# Patient Record
Sex: Female | Born: 1980
Health system: Southern US, Community
[De-identification: ages and names within clinical notes are randomized; demographics above are authoritative.]

## PROBLEM LIST (undated history)

## (undated) DIAGNOSIS — Z8489 Family history of other specified conditions: Secondary | ICD-10-CM

## (undated) DIAGNOSIS — N920 Excessive and frequent menstruation with regular cycle: Secondary | ICD-10-CM

## (undated) DIAGNOSIS — Z9889 Other specified postprocedural states: Secondary | ICD-10-CM

## (undated) DIAGNOSIS — E785 Hyperlipidemia, unspecified: Secondary | ICD-10-CM

## (undated) DIAGNOSIS — D649 Anemia, unspecified: Secondary | ICD-10-CM

## (undated) DIAGNOSIS — F419 Anxiety disorder, unspecified: Secondary | ICD-10-CM

## (undated) DIAGNOSIS — K219 Gastro-esophageal reflux disease without esophagitis: Secondary | ICD-10-CM

## (undated) DIAGNOSIS — N200 Calculus of kidney: Secondary | ICD-10-CM

## (undated) DIAGNOSIS — G43909 Migraine, unspecified, not intractable, without status migrainosus: Secondary | ICD-10-CM

## (undated) DIAGNOSIS — E0590 Thyrotoxicosis, unspecified without thyrotoxic crisis or storm: Secondary | ICD-10-CM

## (undated) HISTORY — PX: FINGER SURGERY: SHX640

## (undated) HISTORY — DX: Migraine, unspecified, not intractable, without status migrainosus: G43.909

## (undated) HISTORY — DX: Other specified postprocedural states: Z98.890

## (undated) HISTORY — PX: SMALL INTESTINE SURGERY: SHX150

## (undated) HISTORY — PX: FRACTURE SURGERY: SHX138

## (undated) HISTORY — DX: Anxiety disorder, unspecified: F41.9

## (undated) HISTORY — DX: Hyperlipidemia, unspecified: E78.5

## (undated) HISTORY — DX: Calculus of kidney: N20.0

## (undated) HISTORY — DX: Gastro-esophageal reflux disease without esophagitis: K21.9

## (undated) HISTORY — PX: APPENDECTOMY: SHX54

---
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
## 2000-02-11 HISTORY — PX: NISSEN FUNDOPLICATION: SHX2091

## 2006-02-10 HISTORY — PX: APPENDECTOMY: SHX54

## 2007-02-11 DIAGNOSIS — Z9889 Other specified postprocedural states: Secondary | ICD-10-CM

## 2007-02-11 HISTORY — DX: Other specified postprocedural states: Z98.890

## 2007-10-11 ENCOUNTER — Emergency Department: Payer: Self-pay | Admitting: Internal Medicine

## 2007-10-21 ENCOUNTER — Ambulatory Visit: Payer: Self-pay | Admitting: Family Medicine

## 2009-02-10 DIAGNOSIS — N200 Calculus of kidney: Secondary | ICD-10-CM

## 2009-02-10 HISTORY — DX: Calculus of kidney: N20.0

## 2009-06-14 ENCOUNTER — Ambulatory Visit: Payer: Self-pay | Admitting: Internal Medicine

## 2009-08-16 ENCOUNTER — Emergency Department: Payer: Self-pay | Admitting: Emergency Medicine

## 2011-01-16 ENCOUNTER — Other Ambulatory Visit: Payer: Self-pay | Admitting: Physician Assistant

## 2011-02-27 ENCOUNTER — Inpatient Hospital Stay: Payer: Self-pay | Admitting: Obstetrics and Gynecology

## 2011-02-28 LAB — CBC WITH DIFFERENTIAL/PLATELET
Basophil #: 0 10*3/uL (ref 0.0–0.1)
Basophil %: 0.4 %
Eosinophil #: 0.1 10*3/uL (ref 0.0–0.7)
HCT: 33.6 % — ABNORMAL LOW (ref 35.0–47.0)
HGB: 12 g/dL (ref 12.0–16.0)
Lymphocyte #: 1.6 10*3/uL (ref 1.0–3.6)
Lymphocyte %: 20.9 %
MCH: 34 pg (ref 26.0–34.0)
MCV: 95 fL (ref 80–100)
Monocyte #: 0.6 10*3/uL (ref 0.0–0.7)
Neutrophil #: 5.4 10*3/uL (ref 1.4–6.5)
RBC: 3.54 10*6/uL — ABNORMAL LOW (ref 3.80–5.20)
RDW: 12.5 % (ref 11.5–14.5)
WBC: 7.8 10*3/uL (ref 3.6–11.0)

## 2011-03-02 LAB — HEMATOCRIT: HCT: 28.1 % — ABNORMAL LOW (ref 35.0–47.0)

## 2011-03-04 LAB — CBC WITH DIFFERENTIAL/PLATELET
Basophil %: 0.3 %
Eosinophil %: 1 %
HGB: 9.5 g/dL — ABNORMAL LOW (ref 12.0–16.0)
Lymphocyte %: 15 %
MCH: 33.3 pg (ref 26.0–34.0)
MCV: 97 fL (ref 80–100)
Monocyte %: 5.1 %
Neutrophil %: 78.6 %
Platelet: 144 10*3/uL — ABNORMAL LOW (ref 150–440)
RBC: 2.84 10*6/uL — ABNORMAL LOW (ref 3.80–5.20)

## 2012-08-10 LAB — HM PAP SMEAR: HM Pap smear: NORMAL

## 2012-09-23 ENCOUNTER — Encounter: Payer: Self-pay | Admitting: Internal Medicine

## 2012-09-23 ENCOUNTER — Ambulatory Visit (INDEPENDENT_AMBULATORY_CARE_PROVIDER_SITE_OTHER): Payer: 59 | Admitting: Internal Medicine

## 2012-09-23 DIAGNOSIS — R1032 Left lower quadrant pain: Secondary | ICD-10-CM

## 2012-09-23 DIAGNOSIS — G43909 Migraine, unspecified, not intractable, without status migrainosus: Secondary | ICD-10-CM

## 2012-09-23 DIAGNOSIS — R1030 Lower abdominal pain, unspecified: Secondary | ICD-10-CM | POA: Insufficient documentation

## 2012-09-23 LAB — POCT URINALYSIS DIPSTICK
Blood, UA: NEGATIVE
Nitrite, UA: NEGATIVE
Urobilinogen, UA: 0.2
pH, UA: 6

## 2012-09-23 NOTE — Assessment & Plan Note (Signed)
Symptoms are concerning for recurrent ovarian cyst. Will get pelvic ultrasound for further evaluation. Urinalysis normal and urine pregnancy test negative today.

## 2012-09-23 NOTE — Progress Notes (Signed)
Subjective:    Patient ID: Kristie Woods, female    DOB: July 22, 1980, 32 y.o.   MRN: [id number]  HPI 32 year old female with history of migraine headaches, kidney stones presents to establish care. She reports she is generally feeling well. Her only concern today is several week history of left lower quadrant abdominal pain. The pain is described as intermittent and aching. It is mild in intensity. In the past, she was diagnosed with a left-sided ovarian cyst. She denies any nausea or vomiting. She denies change in bowel habits. She denies any dysuria, hematuria, fever, chills, flank pain. Appetite is normal.  Outpatient Encounter Prescriptions as of 09/23/2012  Medication Sig Dispense Refill  . Norgestimate-Ethinyl Estradiol Triphasic (TRI-SPRINTEC) 0.18/0.215/0.25 MG-35 MCG tablet Take 1 tablet by mouth daily.       No facility-administered encounter medications on file as of 09/23/2012.   BP 120/82  Pulse 78  Temp(Src) 99 F (37.2 C) (Oral)  Ht 5\' 4"  (1.626 m)  Wt 140 lb (63.504 kg)  BMI 24.02 kg/m2  SpO2 98%  LMP 09/08/2012  Review of Systems  Constitutional: Negative for fever, chills, appetite change, fatigue and unexpected weight change.  HENT: Negative for ear pain, congestion, sore throat, trouble swallowing, neck pain, voice change and sinus pressure.   Eyes: Negative for visual disturbance.  Respiratory: Negative for cough, shortness of breath, wheezing and stridor.   Cardiovascular: Negative for chest pain, palpitations and leg swelling.  Gastrointestinal: Positive for abdominal pain. Negative for nausea, vomiting, diarrhea, constipation, blood in stool, abdominal distention and anal bleeding.  Genitourinary: Negative for dysuria and flank pain.  Musculoskeletal: Negative for myalgias, arthralgias and gait problem.  Skin: Negative for color change and rash.  Neurological: Negative for dizziness and headaches.  Hematological: Negative for adenopathy. Does not bruise/bleed  easily.  Psychiatric/Behavioral: Negative for suicidal ideas, sleep disturbance and dysphoric mood. The patient is not nervous/anxious.        Objective:   Physical Exam  Constitutional: She is oriented to person, place, and time. She appears well-developed and well-nourished. No distress.  HENT:  Head: Normocephalic and atraumatic.  Right Ear: External ear normal.  Left Ear: External ear normal.  Nose: Nose normal.  Mouth/Throat: Oropharynx is clear and moist. No oropharyngeal exudate.  Eyes: Conjunctivae are normal. Pupils are equal, round, and reactive to light. Right eye exhibits no discharge. Left eye exhibits no discharge. No scleral icterus.  Neck: Normal range of motion. Neck supple. No tracheal deviation present. No thyromegaly present.  Cardiovascular: Normal rate, regular rhythm, normal heart sounds and intact distal pulses.  Exam reveals no gallop and no friction rub.   No murmur heard. Pulmonary/Chest: Effort normal and breath sounds normal. No accessory muscle usage. Not tachypneic. No respiratory distress. She has no decreased breath sounds. She has no wheezes. She has no rhonchi. She has no rales. She exhibits no tenderness.  Abdominal: Soft. Bowel sounds are normal. She exhibits no distension and no mass. There is tenderness (very mild LLQ, no palpable mass or abnormality). There is no rebound and no guarding.  Musculoskeletal: Normal range of motion. She exhibits no edema and no tenderness.  Lymphadenopathy:    She has no cervical adenopathy.  Neurological: She is alert and oriented to person, place, and time. No cranial nerve deficit. She exhibits normal muscle tone. Coordination normal.  Skin: Skin is warm and dry. No rash noted. She is not diaphoretic. No erythema. No pallor.  Psychiatric: She has a normal mood and affect. Her behavior  is normal. Judgment and thought content normal.          Assessment & Plan:

## 2012-09-23 NOTE — Assessment & Plan Note (Signed)
Symptoms are relatively infrequent and well controlled with use of Excedrin Migraine. Will continue.

## 2012-09-24 ENCOUNTER — Ambulatory Visit: Payer: Self-pay | Admitting: Internal Medicine

## 2012-09-24 ENCOUNTER — Telehealth: Payer: Self-pay | Admitting: Internal Medicine

## 2012-09-24 NOTE — Telephone Encounter (Signed)
Patient informed and stated she would contact her own GYN for an appointment, which is Dr. Ty Hilts at [facility]

## 2012-09-24 NOTE — Telephone Encounter (Signed)
US pelvis showed left ovarian cyst measuring 2.9x2.4x3.2cm. Given intermittent pain on the left side, I would recommend evaluation with GYN. We can set up referral if pt would like.

## 2012-10-04 ENCOUNTER — Encounter: Payer: Self-pay | Admitting: Internal Medicine

## 2012-12-06 ENCOUNTER — Encounter (INDEPENDENT_AMBULATORY_CARE_PROVIDER_SITE_OTHER): Payer: Self-pay

## 2012-12-06 ENCOUNTER — Ambulatory Visit (INDEPENDENT_AMBULATORY_CARE_PROVIDER_SITE_OTHER): Payer: 59 | Admitting: Internal Medicine

## 2012-12-06 ENCOUNTER — Encounter: Payer: Self-pay | Admitting: Internal Medicine

## 2012-12-06 ENCOUNTER — Ambulatory Visit: Payer: Self-pay | Admitting: Internal Medicine

## 2012-12-06 ENCOUNTER — Other Ambulatory Visit: Payer: Self-pay | Admitting: *Deleted

## 2012-12-06 VITALS — BP 118/84 | HR 80 | Temp 98.4°F | Wt 143.0 lb

## 2012-12-06 DIAGNOSIS — R109 Unspecified abdominal pain: Secondary | ICD-10-CM

## 2012-12-06 DIAGNOSIS — R52 Pain, unspecified: Secondary | ICD-10-CM

## 2012-12-06 LAB — POCT URINALYSIS DIPSTICK
Glucose, UA: NEGATIVE
Ketones, UA: NEGATIVE
Leukocytes, UA: NEGATIVE
pH, UA: 6

## 2012-12-06 MED ORDER — PROMETHAZINE HCL 12.5 MG PO TABS: 12.5000 mg | ORAL_TABLET | Freq: Three times a day (TID) | ORAL | Status: DC | PRN

## 2012-12-06 MED ORDER — CIPROFLOXACIN HCL 500 MG PO TABS: 500.0000 mg | ORAL_TABLET | Freq: Two times a day (BID) | ORAL | Status: DC

## 2012-12-06 MED ORDER — TRAMADOL HCL 50 MG PO TABS: 50.0000 mg | ORAL_TABLET | Freq: Three times a day (TID) | ORAL | Status: DC | PRN

## 2012-12-06 NOTE — Assessment & Plan Note (Addendum)
24hr of severe epigastric and left sided flank pain consistent with previous h/o nephrolithiasis. Urinalysis pos for trace blood. Will get stat CT abd/pel without contrast. Encouraged increased fluid intake, Tramadol prn pain, promethazine for nausea.

## 2012-12-06 NOTE — Telephone Encounter (Signed)
Prescription sent to the pharmacy after receiving call report from [facility] at [facility] radiology dept.

## 2012-12-06 NOTE — Progress Notes (Signed)
  Subjective:    Patient ID: Kristie Woods, female    DOB: 10-20-1980, 32 y.o.   MRN: [id number]  HPI 32 year old female presents for acute visit complaining of 24 hours of epigastric and left flank pain. Pain is described as twisting sensation. Pain is improved with physical activity such as walking. Pain is associated with nausea but no vomiting. Patient denies dysuria, hematuria, fever, chills. She reports this pain is consistent with previous episodes of nephrolithiasis. No known sick contacts. No recent travel.  Outpatient Prescriptions Prior to Visit  Medication Sig Dispense Refill  . Norgestimate-Ethinyl Estradiol Triphasic (TRI-SPRINTEC) 0.18/0.215/0.25 MG-35 MCG tablet Take 1 tablet by mouth daily.       No facility-administered medications prior to visit.   BP 118/84  Pulse 80  Temp(Src) 98.4 F (36.9 C) (Oral)  Wt 143 lb (64.864 kg)  BMI 24.53 kg/m2  SpO2 97%  LMP 11/25/2012    Review of Systems  Constitutional: Positive for fatigue. Negative for fever, chills, appetite change and unexpected weight change.  HENT: Negative for trouble swallowing.   Eyes: Negative for visual disturbance.  Respiratory: Negative for cough and shortness of breath.   Cardiovascular: Negative for chest pain, palpitations and leg swelling.  Gastrointestinal: Positive for nausea and abdominal pain. Negative for vomiting, diarrhea, constipation, blood in stool, abdominal distention and anal bleeding.  Genitourinary: Positive for flank pain. Negative for dysuria, urgency, frequency, hematuria, genital sores, vaginal pain and pelvic pain.  Musculoskeletal: Negative for arthralgias, gait problem, myalgias and neck pain.  Skin: Negative for color change and rash.  Neurological: Negative for dizziness and headaches.  Hematological: Negative for adenopathy. Does not bruise/bleed easily.  Psychiatric/Behavioral: Negative for dysphoric mood. The patient is not nervous/anxious.        Objective:   Physical Exam  Constitutional: She is oriented to person, place, and time. She appears well-developed and well-nourished. No distress.  HENT:  Head: Normocephalic and atraumatic.  Right Ear: External ear normal.  Left Ear: External ear normal.  Nose: Nose normal.  Mouth/Throat: Oropharynx is clear and moist.  Eyes: Conjunctivae are normal. Pupils are equal, round, and reactive to light. Right eye exhibits no discharge. Left eye exhibits no discharge. No scleral icterus.  Neck: Normal range of motion. Neck supple. No tracheal deviation present. No thyromegaly present.  Cardiovascular: Normal rate, regular rhythm, normal heart sounds and intact distal pulses.  Exam reveals no gallop and no friction rub.   No murmur heard. Pulmonary/Chest: Effort normal and breath sounds normal. No accessory muscle usage. Not tachypneic. No respiratory distress. She has no decreased breath sounds. She has no wheezes. She has no rhonchi. She has no rales. She exhibits no tenderness.  Abdominal: Soft. Bowel sounds are normal. She exhibits no distension and no mass. There is tenderness (left lower abdomen and left flank). There is no rebound and no guarding.  Musculoskeletal: Normal range of motion. She exhibits no edema and no tenderness.  Lymphadenopathy:    She has no cervical adenopathy.  Neurological: She is alert and oriented to person, place, and time. No cranial nerve deficit. She exhibits normal muscle tone. Coordination normal.  Skin: Skin is warm and dry. No rash noted. She is not diaphoretic. No erythema. No pallor.  Psychiatric: She has a normal mood and affect. Her behavior is normal. Judgment and thought content normal.          Assessment & Plan:

## 2012-12-07 ENCOUNTER — Telehealth: Payer: Self-pay | Admitting: Internal Medicine

## 2012-12-07 LAB — URINE CULTURE: Organism ID, Bacteria: NO GROWTH

## 2012-12-07 NOTE — Telephone Encounter (Signed)
Final report on CT abdomen showed just the one very small 2.52mm kidney stone on the right. Can you ask pt if her symptoms have improved at all after starting Cipro last night?

## 2012-12-08 NOTE — Telephone Encounter (Signed)
Spoke with patient yesterday, she state she is doing fine. The radiologist showed her the film and told her she had some fluid down there as well where they think the stone had burst.

## 2012-12-09 ENCOUNTER — Ambulatory Visit (INDEPENDENT_AMBULATORY_CARE_PROVIDER_SITE_OTHER): Payer: 59 | Admitting: Internal Medicine

## 2012-12-09 ENCOUNTER — Encounter: Payer: Self-pay | Admitting: Internal Medicine

## 2012-12-09 VITALS — BP 102/78 | HR 74 | Temp 98.2°F | Wt 141.0 lb

## 2012-12-09 DIAGNOSIS — R109 Unspecified abdominal pain: Secondary | ICD-10-CM

## 2012-12-09 DIAGNOSIS — R52 Pain, unspecified: Secondary | ICD-10-CM

## 2012-12-09 LAB — POCT URINALYSIS DIPSTICK
Bilirubin, UA: NEGATIVE
Blood, UA: NEGATIVE
Ketones, UA: NEGATIVE
Leukocytes, UA: NEGATIVE
Nitrite, UA: NEGATIVE
Urobilinogen, UA: 0.2
pH, UA: 6.5

## 2012-12-09 NOTE — Progress Notes (Signed)
  Subjective:    Patient ID: Kristie Woods, female    DOB: 1980-11-08, 32 y.o.   MRN: [id number]  HPI 32YO female with recent episode of flank pain and abdominal pain presents for follow up. Symptoms have completely resolved. CT abd showed small stone right, non-obstructive. Pt never had to use Tramadol or Phenergan. She has continued Cipro. Urine culture negative, so we discussed stopping Cipro today. No new concerns.  Outpatient Encounter Prescriptions as of 12/09/2012  Medication Sig Dispense Refill  . Norgestimate-Ethinyl Estradiol Triphasic (TRI-SPRINTEC) 0.18/0.215/0.25 MG-35 MCG tablet Take 1 tablet by mouth daily.      . [DISCONTINUED] ciprofloxacin (CIPRO) 500 MG tablet Take 1 tablet (500 mg total) by mouth 2 (two) times daily.  14 tablet  0  . [DISCONTINUED] promethazine (PHENERGAN) 12.5 MG tablet Take 1 tablet (12.5 mg total) by mouth every 8 (eight) hours as needed for nausea.  20 tablet  0  . [DISCONTINUED] traMADol (ULTRAM) 50 MG tablet Take 1 tablet (50 mg total) by mouth every 8 (eight) hours as needed for pain.  60 tablet  0   No facility-administered encounter medications on file as of 12/09/2012.   BP 102/78  Pulse 74  Temp(Src) 98.2 F (36.8 C) (Oral)  Wt 141 lb (63.957 kg)  BMI 24.19 kg/m2  SpO2 99%  LMP 11/25/2012  Review of Systems  Constitutional: Negative for fever, chills and fatigue.  Respiratory: Negative for cough and shortness of breath.   Cardiovascular: Negative for chest pain and leg swelling.  Gastrointestinal: Negative for abdominal pain.       Objective:   Physical Exam  Constitutional: She is oriented to person, place, and time. She appears well-developed and well-nourished. No distress.  HENT:  Head: Normocephalic and atraumatic.  Right Ear: External ear normal.  Left Ear: External ear normal.  Nose: Nose normal.  Mouth/Throat: Oropharynx is clear and moist. No oropharyngeal exudate.  Eyes: Conjunctivae are normal. Pupils are equal,  round, and reactive to light. Right eye exhibits no discharge. Left eye exhibits no discharge. No scleral icterus.  Neck: Normal range of motion. Neck supple. No tracheal deviation present. No thyromegaly present.  Cardiovascular: Normal rate, regular rhythm, normal heart sounds and intact distal pulses.  Exam reveals no gallop and no friction rub.   No murmur heard. Pulmonary/Chest: Effort normal and breath sounds normal. No accessory muscle usage. Not tachypneic. No respiratory distress. She has no decreased breath sounds. She has no wheezes. She has no rhonchi. She has no rales. She exhibits no tenderness.  Abdominal: There is no tenderness (no CVA tenderness).  Musculoskeletal: Normal range of motion. She exhibits no edema and no tenderness.  Lymphadenopathy:    She has no cervical adenopathy.  Neurological: She is alert and oriented to person, place, and time. No cranial nerve deficit. She exhibits normal muscle tone. Coordination normal.  Skin: Skin is warm and dry. No rash noted. She is not diaphoretic. No erythema. No pallor.  Psychiatric: She has a normal mood and affect. Her behavior is normal. Judgment and thought content normal.          Assessment & Plan:

## 2012-12-09 NOTE — Assessment & Plan Note (Addendum)
Symptoms have completely resolved. Urine culture was negative. Will stop cipro. CT showed small renal stone on the right side which was non-obstructive. Small amount of fluid in pelvis suggestive of possible ruptured ovarian cyst, however no current cyst present. Will continue increased fluid intake. Follow up prn if symptoms recur.

## 2012-12-21 ENCOUNTER — Encounter: Payer: Self-pay | Admitting: Internal Medicine

## 2014-01-10 LAB — HM PAP SMEAR

## 2014-01-17 ENCOUNTER — Encounter: Payer: Self-pay | Admitting: Internal Medicine

## 2014-01-17 ENCOUNTER — Encounter (INDEPENDENT_AMBULATORY_CARE_PROVIDER_SITE_OTHER): Payer: Self-pay

## 2014-01-17 ENCOUNTER — Ambulatory Visit (INDEPENDENT_AMBULATORY_CARE_PROVIDER_SITE_OTHER): Payer: 59 | Admitting: Internal Medicine

## 2014-01-17 VITALS — BP 99/68 | HR 78 | Temp 98.2°F | Ht 64.0 in | Wt 137.2 lb

## 2014-01-17 DIAGNOSIS — Z0000 Encounter for general adult medical examination without abnormal findings: Secondary | ICD-10-CM

## 2014-01-17 DIAGNOSIS — Z131 Encounter for screening for diabetes mellitus: Secondary | ICD-10-CM | POA: Insufficient documentation

## 2014-01-17 DIAGNOSIS — Z0001 Encounter for general adult medical examination with abnormal findings: Secondary | ICD-10-CM | POA: Insufficient documentation

## 2014-01-17 MED ORDER — ALPRAZOLAM 0.25 MG PO TABS: 0.2500 mg | ORAL_TABLET | Freq: Two times a day (BID) | ORAL | Status: DC | PRN

## 2014-01-17 NOTE — Assessment & Plan Note (Signed)
General medical exam normal today. Pelvic and breast exam deferred as completed by her OB last week. Immunizations UTD. Will check labs including CBC, CMP, lipids, TSH, Vit D. Encouraged healthy diet and exercise. Encourage smoking cessation.

## 2014-01-17 NOTE — Patient Instructions (Signed)

## 2014-01-17 NOTE — Progress Notes (Signed)
   Subjective:    Patient ID: Kristie Woods, female    DOB: Feb 22, 1980, 33 y.o.   MRN: [id number]  HPI 33YO female presents for annual exam.  Feeling well. No concerns today. Planning to travel to [place]. Has some anxiety in crowds and used Alprazolam in the past with improvement. Would like to use this again.  Had pelvic exam with OB last week. Everything normal.   Past medical, surgical, family and social history per today's encounter.  Review of Systems  Constitutional: Negative for fever, chills, appetite change, fatigue and unexpected weight change.  Eyes: Negative for visual disturbance.  Respiratory: Negative for shortness of breath.   Cardiovascular: Negative for chest pain and leg swelling.  Gastrointestinal: Negative for nausea, vomiting, abdominal pain, diarrhea and constipation.  Skin: Negative for color change and rash.  Hematological: Negative for adenopathy. Does not bruise/bleed easily.  Psychiatric/Behavioral: Negative for dysphoric mood. The patient is nervous/anxious.        Objective:    BP 99/68 mmHg  Pulse 78  Temp(Src) 98.2 F (36.8 C) (Oral)  Ht 5\' 4"  (1.626 m)  Wt 137 lb 4 oz (62.256 kg)  BMI 23.55 kg/m2  SpO2 100%  LMP 01/08/2014 Physical Exam  Constitutional: She is oriented to person, place, and time. She appears well-developed and well-nourished. No distress.  HENT:  Head: Normocephalic and atraumatic.  Right Ear: External ear normal.  Left Ear: External ear normal.  Nose: Nose normal.  Mouth/Throat: Oropharynx is clear and moist. No oropharyngeal exudate.  Eyes: Conjunctivae and EOM are normal. Pupils are equal, round, and reactive to light. Right eye exhibits no discharge.  Neck: Normal range of motion. Neck supple. No thyromegaly present.  Cardiovascular: Normal rate, regular rhythm, normal heart sounds and intact distal pulses.  Exam reveals no gallop and no friction rub.   No murmur heard. Pulmonary/Chest: Effort normal. No  respiratory distress. She has no wheezes. She has no rales.  Abdominal: Soft. Bowel sounds are normal. She exhibits no distension and no mass. There is no tenderness. There is no rebound and no guarding.  Musculoskeletal: Normal range of motion. She exhibits no edema or tenderness.  Lymphadenopathy:    She has no cervical adenopathy.  Neurological: She is alert and oriented to person, place, and time. No cranial nerve deficit. Coordination normal.  Skin: Skin is warm and dry. No rash noted. She is not diaphoretic. No erythema. No pallor.  Psychiatric: She has a normal mood and affect. Her behavior is normal. Judgment and thought content normal.          Assessment & Plan:   Problem List Items Addressed This Visit      Unprioritized   Routine general medical examination at a health care facility - Primary    General medical exam normal today. Pelvic and breast exam deferred as completed by her OB last week. Immunizations UTD. Will check labs including CBC, CMP, lipids, TSH, Vit D. Encouraged healthy diet and exercise. Encourage smoking cessation.    Relevant Orders      CBC with Differential      Comprehensive metabolic panel      Lipid panel      Microalbumin / creatinine urine ratio      Vit D  25 hydroxy (rtn osteoporosis monitoring)      TSH       Return in about 1 year (around 01/18/2015) for Physical.

## 2014-01-17 NOTE — Progress Notes (Signed)
Pre visit review using our clinic review tool, if applicable. No additional management support is needed unless otherwise documented below in the visit note. 

## 2014-01-18 ENCOUNTER — Telehealth: Payer: Self-pay | Admitting: Internal Medicine

## 2014-01-18 NOTE — Telephone Encounter (Signed)
emmi emailed °

## 2014-01-24 ENCOUNTER — Other Ambulatory Visit: Payer: 59

## 2014-01-26 ENCOUNTER — Other Ambulatory Visit: Payer: 59

## 2014-01-30 ENCOUNTER — Other Ambulatory Visit (INDEPENDENT_AMBULATORY_CARE_PROVIDER_SITE_OTHER): Payer: 59

## 2014-01-30 DIAGNOSIS — Z0000 Encounter for general adult medical examination without abnormal findings: Secondary | ICD-10-CM

## 2014-01-30 LAB — LIPID PANEL
CHOLESTEROL: 192 mg/dL (ref 0–200)
HDL: 58.4 mg/dL (ref >39.00)
LDL Cholesterol: 114 mg/dL — ABNORMAL HIGH (ref 0–99)
NonHDL: 133.6
TRIGLYCERIDES: 99 mg/dL (ref 0.0–149.0)
Total CHOL/HDL Ratio: 3
VLDL: 19.8 mg/dL (ref 0.0–40.0)

## 2014-01-30 LAB — COMPREHENSIVE METABOLIC PANEL
ALBUMIN: 4 g/dL (ref 3.5–5.2)
ALK PHOS: 53 U/L (ref 39–117)
ALT: 12 U/L (ref 0–35)
AST: 15 U/L (ref 0–37)
BUN: 13 mg/dL (ref 6–23)
CO2: 26 mEq/L (ref 19–32)
Calcium: 8.8 mg/dL (ref 8.4–10.5)
Chloride: 104 mEq/L (ref 96–112)
Creatinine, Ser: 0.8 mg/dL (ref 0.4–1.2)
GFR: 85.85 mL/min (ref >60.00)
GLUCOSE: 90 mg/dL (ref 70–99)
Potassium: 4.2 mEq/L (ref 3.5–5.1)
Sodium: 136 mEq/L (ref 135–145)
Total Bilirubin: 1.4 mg/dL — ABNORMAL HIGH (ref 0.2–1.2)
Total Protein: 6.8 g/dL (ref 6.0–8.3)

## 2014-01-30 LAB — CBC WITH DIFFERENTIAL/PLATELET
BASOS PCT: 0.6 % (ref 0.0–3.0)
Basophils Absolute: 0 10*3/uL (ref 0.0–0.1)
EOS PCT: 2.5 % (ref 0.0–5.0)
Eosinophils Absolute: 0.2 10*3/uL (ref 0.0–0.7)
HEMATOCRIT: 37.2 % (ref 36.0–46.0)
HEMOGLOBIN: 12.2 g/dL (ref 12.0–15.0)
Lymphocytes Relative: 21.4 % (ref 12.0–46.0)
Lymphs Abs: 1.7 10*3/uL (ref 0.7–4.0)
MCHC: 32.9 g/dL (ref 30.0–36.0)
MCV: 85.8 fl (ref 78.0–100.0)
MONO ABS: 0.6 10*3/uL (ref 0.1–1.0)
Monocytes Relative: 7.2 % (ref 3.0–12.0)
NEUTROS ABS: 5.3 10*3/uL (ref 1.4–7.7)
Neutrophils Relative %: 68.3 % (ref 43.0–77.0)
Platelets: 258 10*3/uL (ref 150.0–400.0)
RBC: 4.34 Mil/uL (ref 3.87–5.11)
RDW: 12.8 % (ref 11.5–15.5)
WBC: 7.8 10*3/uL (ref 4.0–10.5)

## 2014-01-30 LAB — MICROALBUMIN / CREATININE URINE RATIO
CREATININE, U: 204 mg/dL
Microalb Creat Ratio: 0.2 mg/g (ref 0.0–30.0)
Microalb, Ur: 0.4 mg/dL (ref 0.0–1.9)

## 2014-01-31 ENCOUNTER — Encounter: Payer: Self-pay | Admitting: Internal Medicine

## 2014-01-31 LAB — TSH: TSH: 1.74 u[IU]/mL (ref 0.35–4.50)

## 2014-01-31 LAB — VITAMIN D 25 HYDROXY (VIT D DEFICIENCY, FRACTURES): VITD: 22.93 ng/mL — ABNORMAL LOW (ref 30.00–100.00)

## 2014-01-31 MED ORDER — PRENATAL PLUS IRON 29-1 MG PO TABS: ORAL_TABLET | ORAL | Status: DC

## 2014-06-04 NOTE — Discharge Summary (Signed)
PATIENT NAME:  Kristie Woods, Kristie Woods MR#:  [id number] DATE OF BIRTH:  September 07, 1980  DATE OF ADMISSION:  02/27/2011 DATE OF DISCHARGE:  03/04/2011  HOSPITAL COURSE: Brought in for induction. Patient was laboring, had developed severe variable decelerations, was taken back for emergent primary low transverse cesarean section. Postoperatively patient did well. Postoperative day #1 hematocrit 28.1%. Patient is discharged to home postoperative day #3 without problems.   DISCHARGE MEDICATIONS:  1. Norco. 2. Patient did receive the On-Q pump for postoperative pain relief.   DISCHARGE INSTRUCTIONS: Staples removed before discharge. She will follow up with Dr. Ouida Sills in two weeks or before if she has wound drainage, fever, nausea, vomiting, heavy vaginal bleeding.  ____________________________ Boykin Nearing, MD tjs:cms D: 03/06/2011 20:37:57 ET T: 03/07/2011 11:23:17 ET JOB#: [id number]  cc: Boykin Nearing, MD, <Dictator> Boykin Nearing MD ELECTRONICALLY SIGNED 03/11/2011 8:45

## 2014-06-04 NOTE — Op Note (Signed)
PATIENT NAME:  Kristie Woods, Kristie Woods MR#:  [id number] DATE OF BIRTH:  1980-06-30  DATE OF PROCEDURE:  03/01/2011  PREOPERATIVE DIAGNOSIS: Nonreassuring fetal monitoring, active labor.   POSTOPERATIVE DIAGNOSIS: Nonreassuring fetal monitoring, active labor.   PROCEDURE: Emergent primary low transverse cesarean section.   SURGEON: Boykin Nearing, MD  FIRST ASSISTANTRonnald Ramp.   ANESTHESIA: General endotracheal.  INDICATIONS: A 34 year old gravida 1, para 0 patient laboring when fetal monitoring demonstrated deep variable decelerations. Pitocin was stopped. Patient underwent in utero resuscitation and was given subcutaneous terbutaline.   DESCRIPTION OF PROCEDURE: After adequate prep and drape patient was administered general endotracheal anesthesia by Dr. Kayleen Memos. Pfannenstiel incision was made. Sharp dissection was used to identify the fascia. Fascia was opened in the midline and opened in transverse fashion. Peritoneum was entered bluntly in the midline and direct low uterine incision was made. Upon entry into the endometrial cavity, clear fluid resulted. Fetal head was brought to the incision. A tight nuchal cord around the neck was reduced. Fetal body was then delivered without difficulty. Infant girl was passed to Dr. Nelda Marseille assigned Apgar scores of 3 and 8. Cord pH 7.28, pCO2 48. Placenta was manually delivered. Uterus was exteriorized and wiped clean with laparotomy tape. Uterine incision was closed with 1 chromic suture in a running locking fashion, good approximation of edges, good hemostasis noted. Fallopian tubes and ovaries appeared normal. Patient had previously received 2 grams Ancef prior to commencement of the case. Intravenous Pitocin administered while performing the repair. Posterior cul-de-sac suctioned. Uterus placed back into the abdominal cavity. Paracolic gutters wiped clean with laparotomy tape. Uterine incision again appeared hemostatic. On-Q system brought up to the  operative field. Two catheters placed infraumbilically subfascially. Fascia was then closed over top of these catheters with 0 Vicryl suture. Of note, Interceed was placed over the uterine incision T-shaped fashion for adhesion prevention. Subcutaneous tissues were then irrigated and bovied for hemostasis and skin was reapproximated with staples. On-Q catheters were then secured with Dermabond and Steri-Strips and were loaded with 5 mL each catheter of 0.5% Marcaine. There were no complications. Estimated blood loss 600 mL. Intraoperative fluids 1000 mL. Patient tolerated procedure, was taken to recovery room in good condition.   ____________________________ Boykin Nearing, MD tjs:cms D: 03/01/2011 20:25:25 ET T: 03/02/2011 09:37:33 ET JOB#: [id number]  cc: Boykin Nearing, MD, <Dictator> Boykin Nearing MD ELECTRONICALLY SIGNED 03/03/2011 9:40

## 2014-06-20 NOTE — H&P (Signed)
L&D Evaluation:  History:   HPI 34 yo G1P0 with LMP of 05/27/10 of 03/03/11 with [place] at [facility] significant for anemia, GBS pos here for IOL scheduled for Rt hip sciatica unrelieved during this pregnancy. No lOF, VB, decreased FM, occas UC after Cervidil last pm. Pt tol Cervidil and husband is at bedside with pt.    Presents with contractions    Patient's Medical History Anemia, Rh Neg, GBS pos    Patient's Surgical History none    Medications Pre Natal Vitamins    Allergies NKDA    Social History EtOH  social ETOH    Family History Non-Contributory   ROS:   ROS All systems were reviewed.  HEENT, CNS, GI, GU, Respiratory, CV, Renal and Musculoskeletal systems were found to be normal.   Exam:   Vital Signs stable    General no apparent distress    Mental Status clear    Heart normal sinus rhythm    Abdomen gravid, non-tender    Estimated Fetal Weight Average for gestational age    Back no CVAT    Edema 1+    Reflexes 1+    Clonus negative    Pelvic 1/60/vtx-2    Mebranes Intact    FHT normal rate with no decels, reactive NST with 2 accels with 15x15 bpm    Ucx regular    Skin dry    Lymph no lymphadenopathy   Impression:   Impression early labor, IUP at 39 4/7 weeks   Plan:   Plan monitor contractions and for cervical change    Comments Risks, benefits, alternatives, disc with pt. Pitocin infusing per protocol and foley bulb placed with 30 cc's balloon. Taped to Rt leg   Electronic Signatures: Catheryn Bacon (CNM)  (Signed 18-Jan-13 09:35)  Authored: L&D Evaluation   Last Updated: 18-Jan-13 09:35 by Catheryn Bacon (CNM)

## 2014-07-19 DIAGNOSIS — Z3490 Encounter for supervision of normal pregnancy, unspecified, unspecified trimester: Secondary | ICD-10-CM | POA: Insufficient documentation

## 2015-01-16 ENCOUNTER — Other Ambulatory Visit: Payer: 59

## 2015-01-23 ENCOUNTER — Encounter: Payer: 59 | Admitting: Internal Medicine

## 2015-01-24 NOTE — H&P (Signed)
Kristie Woods is a 34 y.o. female G2P1 with edc 02/22/15 based on LMP of 05/18/2014 presenting for elective repeat c/s and on Q pump on 02/16/2015. History  POBX : prior c/s  OB History    No data available     Past Medical History  Diagnosis Date  . GERD (gastroesophageal reflux disease)   . Kidney stones 2011  . Hyperlipidemia   . Migraine     occasional, no regular meds, uses OTC Excedrine Migraine  . H/O colposcopy with cervical biopsy 2009    [facility]   Past Surgical History  Procedure Laterality Date  . Appendectomy    . Cesarean section  2013    NRFHT  . Nissen fundoplication  [id number]   Family History: family history includes Alcohol abuse in her maternal grandmother; Crohn's disease in her brother; Heart disease in her father; Hyperlipidemia in her father and mother; Stroke (age of onset: 60) in her father; Thyroid disease in her mother; Ulcerative colitis in her brother. Social History:  reports that she has been smoking.  She has never used smokeless tobacco. She reports that she drinks alcohol. She reports that she does not use illicit drugs.   Prenatal Transfer Tool  Maternal Diabetes: No Genetic Screening: Declined Maternal Ultrasounds/Referrals: Normal Fetal Ultrasounds or other Referrals:  None Maternal Substance Abuse:  No Significant Maternal Medications:  None Significant Maternal Lab Results:  None Other Comments:  None  ROSunremarkable     There were no vitals taken for this visit. Exam Physical Exam  Prenatal labs: ABO, Rh:  A- Antibody:    neg Rubella:  immune  RPR:   neg HBsAg:   neg HIV:   neg GBS:   unknown   Assessment/Plan: Elective repeat LTCS + on Q pump 39+[redacted] week EGA   Risks and benefits of the procedure have been discussed with pt  All questions answered  Walker Paddack 01/24/2015, 12:01 PM

## 2015-01-26 DIAGNOSIS — A491 Streptococcal infection, unspecified site: Secondary | ICD-10-CM

## 2015-01-26 HISTORY — DX: Streptococcal infection, unspecified site: A49.1

## 2015-02-11 HISTORY — DX: Maternal care for unspecified type scar from previous cesarean delivery: O34.219

## 2015-02-14 DIAGNOSIS — D62 Acute posthemorrhagic anemia: Secondary | ICD-10-CM | POA: Diagnosis not present

## 2015-02-14 DIAGNOSIS — O34211 Maternal care for low transverse scar from previous cesarean delivery: Secondary | ICD-10-CM | POA: Diagnosis not present

## 2015-02-14 DIAGNOSIS — O99284 Endocrine, nutritional and metabolic diseases complicating childbirth: Secondary | ICD-10-CM | POA: Diagnosis not present

## 2015-02-14 DIAGNOSIS — O9962 Diseases of the digestive system complicating childbirth: Secondary | ICD-10-CM | POA: Diagnosis not present

## 2015-02-14 DIAGNOSIS — O99334 Smoking (tobacco) complicating childbirth: Secondary | ICD-10-CM | POA: Diagnosis not present

## 2015-02-14 DIAGNOSIS — E785 Hyperlipidemia, unspecified: Secondary | ICD-10-CM | POA: Diagnosis not present

## 2015-02-14 DIAGNOSIS — K219 Gastro-esophageal reflux disease without esophagitis: Secondary | ICD-10-CM | POA: Diagnosis not present

## 2015-02-14 DIAGNOSIS — Z3A39 39 weeks gestation of pregnancy: Secondary | ICD-10-CM | POA: Diagnosis not present

## 2015-02-15 ENCOUNTER — Encounter
Admission: RE | Admit: 2015-02-15 | Discharge: 2015-02-15 | Disposition: A | Discharge status: Home / Self Care | Payer: 59 | Source: Ambulatory Visit | Attending: Obstetrics and Gynecology | Admitting: Obstetrics and Gynecology

## 2015-02-15 HISTORY — DX: Anemia, unspecified: D64.9

## 2015-02-15 LAB — CBC
HEMATOCRIT: 35.8 % (ref 35.0–47.0)
Hemoglobin: 12.1 g/dL (ref 12.0–16.0)
MCH: 29.6 pg (ref 26.0–34.0)
MCHC: 33.8 g/dL (ref 32.0–36.0)
MCV: 87.6 fL (ref 80.0–100.0)
Platelets: 160 10*3/uL (ref 150–440)
RBC: 4.08 MIL/uL (ref 3.80–5.20)
RDW: 13.3 % (ref 11.5–14.5)
WBC: 7.8 10*3/uL (ref 3.6–11.0)

## 2015-02-15 LAB — ABO/RH: ABO/RH(D): A NEG

## 2015-02-16 ENCOUNTER — Inpatient Hospital Stay
Admission: RE | Admit: 2015-02-16 | Discharge: 2015-02-19 | DRG: 765 | Disposition: A | Discharge status: Home / Self Care | Payer: 59 | Source: Ambulatory Visit | Attending: Obstetrics and Gynecology | Admitting: Obstetrics and Gynecology

## 2015-02-16 ENCOUNTER — Inpatient Hospital Stay: Payer: 59 | Admitting: Anesthesiology

## 2015-02-16 ENCOUNTER — Encounter
Admission: RE | Disposition: A | Discharge status: Home / Self Care | Payer: Self-pay | Source: Ambulatory Visit | Attending: Obstetrics and Gynecology

## 2015-02-16 DIAGNOSIS — O9962 Diseases of the digestive system complicating childbirth: Secondary | ICD-10-CM | POA: Diagnosis present

## 2015-02-16 DIAGNOSIS — Z811 Family history of alcohol abuse and dependence: Secondary | ICD-10-CM

## 2015-02-16 DIAGNOSIS — O9902 Anemia complicating childbirth: Secondary | ICD-10-CM | POA: Diagnosis present

## 2015-02-16 DIAGNOSIS — O34211 Maternal care for low transverse scar from previous cesarean delivery: Principal | ICD-10-CM | POA: Diagnosis present

## 2015-02-16 DIAGNOSIS — O99334 Smoking (tobacco) complicating childbirth: Secondary | ICD-10-CM | POA: Diagnosis present

## 2015-02-16 DIAGNOSIS — K219 Gastro-esophageal reflux disease without esophagitis: Secondary | ICD-10-CM | POA: Diagnosis present

## 2015-02-16 DIAGNOSIS — D62 Acute posthemorrhagic anemia: Secondary | ICD-10-CM | POA: Diagnosis present

## 2015-02-16 DIAGNOSIS — Z3A00 Weeks of gestation of pregnancy not specified: Secondary | ICD-10-CM | POA: Diagnosis not present

## 2015-02-16 DIAGNOSIS — Z8249 Family history of ischemic heart disease and other diseases of the circulatory system: Secondary | ICD-10-CM | POA: Diagnosis not present

## 2015-02-16 DIAGNOSIS — Z823 Family history of stroke: Secondary | ICD-10-CM | POA: Diagnosis not present

## 2015-02-16 DIAGNOSIS — Z3A39 39 weeks gestation of pregnancy: Secondary | ICD-10-CM

## 2015-02-16 DIAGNOSIS — O99284 Endocrine, nutritional and metabolic diseases complicating childbirth: Secondary | ICD-10-CM | POA: Diagnosis present

## 2015-02-16 DIAGNOSIS — Z9889 Other specified postprocedural states: Secondary | ICD-10-CM

## 2015-02-16 DIAGNOSIS — G8918 Other acute postprocedural pain: Secondary | ICD-10-CM | POA: Diagnosis not present

## 2015-02-16 DIAGNOSIS — E785 Hyperlipidemia, unspecified: Secondary | ICD-10-CM | POA: Diagnosis present

## 2015-02-16 DIAGNOSIS — R1084 Generalized abdominal pain: Secondary | ICD-10-CM | POA: Diagnosis not present

## 2015-02-16 DIAGNOSIS — O34219 Maternal care for unspecified type scar from previous cesarean delivery: Secondary | ICD-10-CM | POA: Diagnosis present

## 2015-02-16 DIAGNOSIS — F17210 Nicotine dependence, cigarettes, uncomplicated: Secondary | ICD-10-CM | POA: Diagnosis present

## 2015-02-16 DIAGNOSIS — O99824 Streptococcus B carrier state complicating childbirth: Secondary | ICD-10-CM | POA: Diagnosis not present

## 2015-02-16 LAB — TYPE AND SCREEN
ABO/RH(D): A NEG
ANTIBODY SCREEN: POSITIVE
Extend sample reason: UNDETERMINED

## 2015-02-16 LAB — RPR: RPR: NONREACTIVE

## 2015-02-16 SURGERY — Surgical Case
Anesthesia: Spinal

## 2015-02-16 MED ORDER — IBUPROFEN 600 MG PO TABS: 600.0000 mg | ORAL_TABLET | Freq: Four times a day (QID) | ORAL | Status: DC

## 2015-02-16 MED ORDER — LIDOCAINE HCL (PF) 1 % IJ SOLN
30.0000 mL | INTRAMUSCULAR | Status: DC | PRN
  Filled 2015-02-16: qty 30

## 2015-02-16 MED ORDER — DIPHENHYDRAMINE HCL 25 MG PO CAPS: 25.0000 mg | ORAL_CAPSULE | ORAL | Status: DC | PRN

## 2015-02-16 MED ORDER — NALBUPHINE HCL 10 MG/ML IJ SOLN
5.0000 mg | INTRAMUSCULAR | Status: DC | PRN
Start: 2015-02-16 — End: 2015-02-16

## 2015-02-16 MED ORDER — OXYCODONE-ACETAMINOPHEN 5-325 MG PO TABS: 2.0000 | ORAL_TABLET | ORAL | Status: DC | PRN

## 2015-02-16 MED ORDER — SODIUM CHLORIDE 0.9 % IJ SOLN: 3.0000 mL | INTRAMUSCULAR | Status: DC | PRN

## 2015-02-16 MED ORDER — LACTATED RINGERS IV SOLN: INTRAVENOUS | Status: DC

## 2015-02-16 MED ORDER — IBUPROFEN 600 MG PO TABS
600.0000 mg | ORAL_TABLET | Freq: Four times a day (QID) | ORAL | Status: DC | PRN
  Administered 2015-02-17 – 2015-02-18 (×2): 600 mg via ORAL
  Filled 2015-02-16 (×5): qty 1

## 2015-02-16 MED ORDER — PHENYLEPHRINE HCL 10 MG/ML IJ SOLN
INTRAMUSCULAR | Status: DC | PRN
  Administered 2015-02-16 (×3): 100 ug via INTRAVENOUS

## 2015-02-16 MED ORDER — BUPIVACAINE HCL (PF) 0.5 % IJ SOLN
INTRAMUSCULAR | Status: DC | PRN
  Administered 2015-02-16: 10 mL

## 2015-02-16 MED ORDER — CEFAZOLIN SODIUM-DEXTROSE 2-3 GM-% IV SOLR
2.0000 g | Freq: Once | INTRAVENOUS | Status: AC
  Administered 2015-02-16: 2 g via INTRAVENOUS
  Filled 2015-02-16: qty 50

## 2015-02-16 MED ORDER — WITCH HAZEL-GLYCERIN EX PADS: 1.0000 "application " | MEDICATED_PAD | CUTANEOUS | Status: DC | PRN

## 2015-02-16 MED ORDER — CITRIC ACID-SODIUM CITRATE 334-500 MG/5ML PO SOLN
ORAL | Status: AC
  Administered 2015-02-16: 30 mL via ORAL
  Filled 2015-02-16: qty 15

## 2015-02-16 MED ORDER — ACETAMINOPHEN 325 MG PO TABS: 650.0000 mg | ORAL_TABLET | ORAL | Status: DC | PRN

## 2015-02-16 MED ORDER — LANOLIN HYDROUS EX OINT
1.0000 | TOPICAL_OINTMENT | CUTANEOUS | Status: DC | PRN
Start: 2015-02-16 — End: 2015-02-19

## 2015-02-16 MED ORDER — MENTHOL 3 MG MT LOZG: 1.0000 | LOZENGE | OROMUCOSAL | Status: DC | PRN

## 2015-02-16 MED ORDER — BUPIVACAINE HCL (PF) 0.75 % IJ SOLN
INTRAMUSCULAR | Status: DC | PRN
  Administered 2015-02-16: 1.8 mL via INTRATHECAL

## 2015-02-16 MED ORDER — SIMETHICONE 80 MG PO CHEW: 80.0000 mg | CHEWABLE_TABLET | ORAL | Status: DC

## 2015-02-16 MED ORDER — SCOPOLAMINE 1 MG/3DAYS TD PT72: 1.0000 | MEDICATED_PATCH | Freq: Once | TRANSDERMAL | Status: DC

## 2015-02-16 MED ORDER — ZOLPIDEM TARTRATE 5 MG PO TABS: 5.0000 mg | ORAL_TABLET | Freq: Every evening | ORAL | Status: DC | PRN

## 2015-02-16 MED ORDER — NALBUPHINE HCL 10 MG/ML IJ SOLN: 5.0000 mg | Freq: Once | INTRAMUSCULAR | Status: DC | PRN

## 2015-02-16 MED ORDER — CITRIC ACID-SODIUM CITRATE 334-500 MG/5ML PO SOLN
30.0000 mL | ORAL | Status: DC | PRN
  Administered 2015-02-16: 30 mL via ORAL

## 2015-02-16 MED ORDER — ONDANSETRON HCL 4 MG/2ML IJ SOLN: 4.0000 mg | Freq: Four times a day (QID) | INTRAMUSCULAR | Status: DC | PRN

## 2015-02-16 MED ORDER — FENTANYL CITRATE (PF) 100 MCG/2ML IJ SOLN: 25.0000 ug | INTRAMUSCULAR | Status: DC | PRN

## 2015-02-16 MED ORDER — SIMETHICONE 80 MG PO CHEW
80.0000 mg | CHEWABLE_TABLET | ORAL | Status: DC | PRN
  Filled 2015-02-16: qty 1

## 2015-02-16 MED ORDER — OXYTOCIN 40 UNITS IN LACTATED RINGERS INFUSION - SIMPLE MED: 2.5000 [IU]/h | INTRAVENOUS | Status: DC

## 2015-02-16 MED ORDER — OXYTOCIN BOLUS FROM INFUSION: 500.0000 mL | INTRAVENOUS | Status: DC

## 2015-02-16 MED ORDER — OXYCODONE-ACETAMINOPHEN 5-325 MG PO TABS: 1.0000 | ORAL_TABLET | ORAL | Status: DC | PRN

## 2015-02-16 MED ORDER — LACTATED RINGERS IV SOLN
500.0000 mL | INTRAVENOUS | Status: DC | PRN
  Administered 2015-02-16: 1000 mL via INTRAVENOUS

## 2015-02-16 MED ORDER — BUPIVACAINE 0.25 % ON-Q PUMP DUAL CATH 400 ML: 400.0000 mL | INJECTION | Status: DC

## 2015-02-16 MED ORDER — SIMETHICONE 80 MG PO CHEW
80.0000 mg | CHEWABLE_TABLET | Freq: Three times a day (TID) | ORAL | Status: DC
  Administered 2015-02-17 – 2015-02-18 (×4): 80 mg via ORAL
  Filled 2015-02-16 (×7): qty 1

## 2015-02-16 MED ORDER — ONDANSETRON HCL 4 MG/2ML IJ SOLN
4.0000 mg | Freq: Three times a day (TID) | INTRAMUSCULAR | Status: DC | PRN
  Filled 2015-02-16: qty 2

## 2015-02-16 MED ORDER — NALOXONE HCL 2 MG/2ML IJ SOSY
1.0000 ug/kg/h | PREFILLED_SYRINGE | INTRAVENOUS | Status: DC | PRN
  Filled 2015-02-16: qty 2

## 2015-02-16 MED ORDER — MEPERIDINE HCL 25 MG/ML IJ SOLN: 6.2500 mg | INTRAMUSCULAR | Status: DC | PRN

## 2015-02-16 MED ORDER — NALOXONE HCL 0.4 MG/ML IJ SOLN: 0.4000 mg | INTRAMUSCULAR | Status: DC | PRN

## 2015-02-16 MED ORDER — NALBUPHINE HCL 10 MG/ML IJ SOLN: 5.0000 mg | INTRAMUSCULAR | Status: DC | PRN

## 2015-02-16 MED ORDER — DIPHENHYDRAMINE HCL 50 MG/ML IJ SOLN: 12.5000 mg | INTRAMUSCULAR | Status: DC | PRN

## 2015-02-16 MED ORDER — OXYTOCIN 40 UNITS IN LACTATED RINGERS INFUSION - SIMPLE MED
INTRAVENOUS | Status: AC
  Administered 2015-02-16: 62.5 mL/h via INTRAVENOUS
  Filled 2015-02-16: qty 1000

## 2015-02-16 MED ORDER — TERBUTALINE SULFATE 1 MG/ML IJ SOLN: 0.2500 mg | Freq: Once | INTRAMUSCULAR | Status: DC

## 2015-02-16 MED ORDER — PRENATAL MULTIVITAMIN CH
1.0000 | ORAL_TABLET | Freq: Every day | ORAL | Status: DC
  Administered 2015-02-17 – 2015-02-19 (×3): 1 via ORAL
  Filled 2015-02-16 (×3): qty 1

## 2015-02-16 MED ORDER — ONDANSETRON HCL 4 MG/2ML IJ SOLN
4.0000 mg | Freq: Once | INTRAMUSCULAR | Status: AC | PRN
  Administered 2015-02-16: 4 mg via INTRAVENOUS

## 2015-02-16 MED ORDER — OXYTOCIN 40 UNITS IN LACTATED RINGERS INFUSION - SIMPLE MED
62.5000 mL/h | INTRAVENOUS | Status: DC
  Administered 2015-02-16: 62.5 mL/h via INTRAVENOUS
  Filled 2015-02-16: qty 1000

## 2015-02-16 MED ORDER — BUPIVACAINE HCL (PF) 0.5 % IJ SOLN
INTRAMUSCULAR | Status: AC
  Filled 2015-02-16: qty 30

## 2015-02-16 MED ORDER — DIBUCAINE 1 % RE OINT: 1.0000 "application " | TOPICAL_OINTMENT | RECTAL | Status: DC | PRN

## 2015-02-16 MED ORDER — TETANUS-DIPHTH-ACELL PERTUSSIS 5-2.5-18.5 LF-MCG/0.5 IM SUSP: 0.5000 mL | Freq: Once | INTRAMUSCULAR | Status: DC

## 2015-02-16 MED ORDER — LACTATED RINGERS IV SOLN
INTRAVENOUS | Status: DC
  Administered 2015-02-16: 08:00:00 via INTRAVENOUS

## 2015-02-16 MED ORDER — OXYTOCIN 10 UNIT/ML IJ SOLN
40.0000 [IU] | INTRAVENOUS | Status: DC | PRN
  Administered 2015-02-16: 40 [IU] via INTRAVENOUS

## 2015-02-16 MED ORDER — BUPIVACAINE 0.25 % ON-Q PUMP DUAL CATH 400 ML
INJECTION | Status: AC
  Filled 2015-02-16: qty 400

## 2015-02-16 MED ORDER — SENNOSIDES-DOCUSATE SODIUM 8.6-50 MG PO TABS
2.0000 | ORAL_TABLET | ORAL | Status: DC
  Filled 2015-02-16 (×2): qty 2

## 2015-02-16 SURGICAL SUPPLY — 23 items
BARRIER ADHS 3X4 INTERCEED (GAUZE/BANDAGES/DRESSINGS) IMPLANT
CANISTER SUCT 3000ML (MISCELLANEOUS) ×2 IMPLANT
CATH KIT ON-Q SILVERSOAK 5IN (CATHETERS) ×4 IMPLANT
CHLORAPREP W/TINT 26ML (MISCELLANEOUS) ×2 IMPLANT
DRSG TEGADERM 8X12 (GAUZE/BANDAGES/DRESSINGS) ×2 IMPLANT
DRSG TELFA 3X8 NADH (GAUZE/BANDAGES/DRESSINGS) ×2 IMPLANT
ELECT CAUTERY BLADE 6.4 (BLADE) ×2 IMPLANT
GAUZE SPONGE 4X4 12PLY STRL (GAUZE/BANDAGES/DRESSINGS) ×2 IMPLANT
GLOVE BIO SURGEON STRL SZ8 (GLOVE) ×8 IMPLANT
GOWN STRL REUS W/ TWL LRG LVL3 (GOWN DISPOSABLE) ×2 IMPLANT
GOWN STRL REUS W/ TWL XL LVL3 (GOWN DISPOSABLE) ×1 IMPLANT
GOWN STRL REUS W/TWL LRG LVL3 (GOWN DISPOSABLE) ×2
GOWN STRL REUS W/TWL XL LVL3 (GOWN DISPOSABLE) ×1
NS IRRIG 1000ML POUR BTL (IV SOLUTION) ×2 IMPLANT
PACK C SECTION AR (MISCELLANEOUS) ×2 IMPLANT
PAD GROUND ADULT SPLIT (MISCELLANEOUS) ×2 IMPLANT
PAD OB MATERNITY 4.3X12.25 (PERSONAL CARE ITEMS) ×2 IMPLANT
PAD PREP 24X41 OB/GYN DISP (PERSONAL CARE ITEMS) ×2 IMPLANT
STAPLER INSORB 30 2030 C-SECTI (MISCELLANEOUS) ×2 IMPLANT
STRAP SAFETY BODY (MISCELLANEOUS) ×2 IMPLANT
SUT CHROMIC 1 CTX 36 (SUTURE) ×6 IMPLANT
SUT PLAIN GUT 0 (SUTURE) IMPLANT
SUT VIC AB 0 CT1 36 (SUTURE) ×6 IMPLANT

## 2015-02-16 NOTE — Plan of Care (Signed)
Pt moved to MBU 340 via bed in stable condition. Report to next shift Conservation officer, historic buildings. Lenore Cordia RNC

## 2015-02-16 NOTE — Transfer of Care (Signed)
Immediate Anesthesia Transfer of Care Note  Patient: Kristie Woods  Procedure(s) Performed: Procedure(s): CESAREAN SECTION (N/A)  Patient Location: PACU  Anesthesia Type:Spinal  Level of Consciousness: awake  Airway & Oxygen Therapy: Patient Spontanous Breathing and Patient connected to nasal cannula oxygen  Post-op Assessment: Report given to RN and Post -op Vital signs reviewed and stable  Post vital signs: Reviewed and stable  Last Vitals:  Filed Vitals:   02/16/15 0557  BP: 98/70  Pulse: 22  Temp: 36.9 C  Resp: 16    Complications: No apparent anesthesia complications

## 2015-02-16 NOTE — Anesthesia Procedure Notes (Addendum)
Spinal Patient location during procedure: OB Start time: 02/16/2015 7:40 AM End time: 02/16/2015 7:51 AM Reason for block: at surgeon's request Staffing Anesthesiologist: Marline Backbone F Performed by: anesthesiologist  Preanesthetic Checklist Completed: patient identified, site marked, surgical consent, pre-op evaluation, timeout performed, IV checked, risks and benefits discussed, monitors and equipment checked and at surgeon's request Spinal Block Patient position: sitting Prep: Betadine Patient monitoring: heart rate and blood pressure Approach: midline Location: L3-4 Injection technique: single-shot Needle Needle type: Quincke  Needle gauge: 25 G Needle length: 9 cm Needle insertion depth: 6 cm Assessment Sensory level: T6  Date/Time: 02/16/2015 7:40 AM Performed by: Allean Found Pre-anesthesia Checklist: Patient identified, Emergency Drugs available, Suction available, Patient being monitored and Timeout performed Patient Re-evaluated:Patient Re-evaluated prior to inductionOxygen Delivery Method: Nasal cannula

## 2015-02-16 NOTE — Progress Notes (Signed)
Pt interviewed . Labs nl . Ready to proceed with repeat LTCS + ON q pump. All questions answered

## 2015-02-16 NOTE — Brief Op Note (Signed)
02/16/2015  8:56 AM  PATIENT:  Kristie Woods  35 y.o. female  PRE-OPERATIVE DIAGNOSIS:  elective repeat  POST-OPERATIVE DIAGNOSIS:  prior c-section  PROCEDURE:  Procedure(s): CESAREAN SECTION (N/A)  SURGEON:  Surgeon(s) and Role:    Boykin Nearing, MD - Primary  PHYSICIAN ASSISTANT:   ASSISTANTS: sigmon   ANESTHESIA:   spinal  EBL:  Total I/O In: 1400 [I.V.:1400] Out: 550 [Urine:150; Blood:400]  BLOOD ADMINISTERED:none  DRAINS: Urinary Catheter (Foley)   LOCAL MEDICATIONS USED:  MARCAINE     SPECIMEN:  No Specimen  DISPOSITION OF SPECIMEN:  N/A  COUNTS:  YES  TOURNIQUET:  * No tourniquets in log *  DICTATION: .Other Dictation: Dictation Number verbal  PLAN OF CARE: Admit to inpatient   PATIENT DISPOSITION:  PACU - hemodynamically stable.   Delay start of Pharmacological VTE agent (>24hrs) due to surgical blood loss or risk of bleeding: not applicable

## 2015-02-16 NOTE — Anesthesia Preprocedure Evaluation (Addendum)
Anesthesia Evaluation  Patient identified by MRN, date of birth, ID band Patient awake    Reviewed: Allergy & Precautions, NPO status   Airway Mallampati: II       Dental no notable dental hx.    Pulmonary neg pulmonary ROS, former smoker,    breath sounds clear to auscultation       Cardiovascular negative cardio ROS   Rhythm:Regular     Neuro/Psych    GI/Hepatic negative GI ROS, Neg liver ROS, GERD  Medicated,  Endo/Other    Renal/GU      Musculoskeletal   Abdominal Normal abdominal exam  (+)   Peds negative pediatric ROS (+)  Hematology  (+) anemia ,   Anesthesia Other Findings   Reproductive/Obstetrics                            Anesthesia Physical Anesthesia Plan  ASA: II  Anesthesia Plan: Spinal   Post-op Pain Management:    Induction:   Airway Management Planned: Natural Airway  Additional Equipment:   Intra-op Plan:   Post-operative Plan:   Informed Consent: I have reviewed the patients History and Physical, chart, labs and discussed the procedure including the risks, benefits and alternatives for the proposed anesthesia with the patient or authorized representative who has indicated his/her understanding and acceptance.     Plan Discussed with: CRNA  Anesthesia Plan Comments:         Anesthesia Quick Evaluation

## 2015-02-17 LAB — CBC
HCT: 32.7 % — ABNORMAL LOW (ref 35.0–47.0)
Hemoglobin: 11 g/dL — ABNORMAL LOW (ref 12.0–16.0)
MCH: 29 pg (ref 26.0–34.0)
MCHC: 33.5 g/dL (ref 32.0–36.0)
MCV: 86.7 fL (ref 80.0–100.0)
PLATELETS: 144 10*3/uL — AB (ref 150–440)
RBC: 3.77 MIL/uL — AB (ref 3.80–5.20)
RDW: 13.2 % (ref 11.5–14.5)
WBC: 11.3 10*3/uL — ABNORMAL HIGH (ref 3.6–11.0)

## 2015-02-17 MED ORDER — WHITE PETROLATUM GEL
Status: AC
  Filled 2015-02-17: qty 20

## 2015-02-17 NOTE — Progress Notes (Addendum)
  Subjective:   Feels well and is nursing the infant well  Objective:  Blood pressure 101/62, pulse 86, temperature 98.7 F (37.1 C), temperature source Oral, resp. rate 16, height 5\' 4"  (1.626 m), weight 177 lb (80.287 kg), SpO2 98 %, unknown if currently breastfeeding.  General: NAD Heart: S1S2, RRR, no M/R/G. Pulmonary: no increased work of breathing, Lungs are CTA bilat, no W/R/R. Abdomen:Slight-distended, non-tender, fundus firm at level of umbilicus, +BS, passing gas Incision: Dressing removed, Insorb sutures intact.  Extremities: no edema, no erythema, no tenderness  Results for orders placed or performed during the hospital encounter of 02/16/15 (from the past 72 hour(s))  CBC     Status: Abnormal   Collection Time: 02/17/15  6:20 AM  Result Value Ref Range   WBC 11.3 (H) 3.6 - 11.0 K/uL   RBC 3.77 (L) 3.80 - 5.20 MIL/uL   Hemoglobin 11.0 (L) 12.0 - 16.0 g/dL   HCT 32.7 (L) 35.0 - 47.0 %   MCV 86.7 80.0 - 100.0 fL   MCH 29.0 26.0 - 34.0 pg   MCHC 33.5 32.0 - 36.0 g/dL   RDW 13.2 11.5 - 14.5 %   Platelets 144 (L) 150 - 440 K/uL     Assessment:   35 y.o. G2P1 postoperativeday # 1   Plan:  1) Acute blood loss anemia - hemodynamically stable and asymptomatic HGB: 11.3 - po ferrous sulfate  2) --/--/A NEG (01/05 0841) /   / Varicella Immune, RI,   3) TDAP status:UTD  4) Breast Contraception: condoms  5) Disposition: DC on Monday

## 2015-02-17 NOTE — Op Note (Signed)
Kristie Woods, Kristie Woods NO.:  [id number]  MEDICAL RECORD NO.:  [id number]  LOCATION:  340A                         FACILITY:  [facility]  PHYSICIAN:  Laverta Baltimore, MDDATE OF BIRTH:  30-Apr-1980  DATE OF PROCEDURE: DATE OF DISCHARGE:                              OPERATIVE REPORT   PREOPERATIVE DIAGNOSIS: 1. 39+ 1 week estimated gestational age. 2. Elective repeat cesarean section.  POSTOPERATIVE DIAGNOSIS: 1. 39+ 1 week estimated gestational age. 2. Elective repeat cesarean section.  PROCEDURE: 1. Repeat low transverse cesarean section. 2. On-Q pump placement.  ANESTHESIA:  Spinal.  SURGEON:  Laverta Baltimore, MD  FIRST ASSISTANT:  Sigmon, certified nurse-midwife.  INDICATION:  A 35 year old, gravida 2, para 1 patient at 39+ 1 weeks estimated gestational age.  Patient has elected for repeat cesarean section.  DESCRIPTION OF PROCEDURE:  After adequate spinal anesthesia, patient was placed in dorsal supine position.  Hip roll placed under the right side. Patient previously received 2 g IV Ancef prior to commencement of the case.  A time-out was performed.  The patient's abdomen was prepped and draped in normal sterile fashion.  A Pfannenstiel incision was made 2 fingerbreadths above the symphysis pubis.  Sharp dissection was used to identify the fascia.  Fascia was opened in the midline and opened in a transverse fashion.  Superior aspect of the fascia was grasped with Kocher clamps and the recti muscles were dissected free.  The inferior aspect of the fascia was grasped with Kocher clamps, and pyramidalis muscle was dissected free.  Entry into the peritoneal cavity was accomplished sharply.  A direct low uterine incision was made given that the bladder flap was too adherent and could not be created.  Once entry into the endometrial cavity, clear fluid resulted.  The incision was extended with blunt transverse traction.  Fetal head was brought to  the incision and vacuum was applied to the occiput.  With 1 gentle pull and fundal pressure, head was delivered and the vacuum was removed.  Fetal head, shoulders, and body were delivered without difficulty.  The cord was doubly clamped and female infant was passed to the nursery staff who assigned Apgar scores of 9 and 9.  The placenta was manually delivered and the uterus was exteriorized and wiped clean with laparotomy tape. Cervix was opened with a ring forceps and this was passed off the operative field.  Uterine incision was closed with 1 chromic suture in a running, locking fashion, two additional figure-of-eight sutures were utilized for good hemostasis.  Fallopian tubes and ovaries appeared normal.  The posterior cul-de-sac was irrigated and suctioned and the uterus was placed back into the abdominal cavity.  Again, the uterine incision appeared hemostatic.  The paracolic gutters were wiped clean with laparotomy tape.  The uterine incision was covered with the Interceed in a T-shaped fashion.  The superior aspect of the fascia was regrasped with Kocher clamps, and the on-Q pump catheters were advanced from infraumbilical position to a subfascial position.  The fascia was then closed over top these catheters with 0 Vicryl suture in a running, nonlocking fashion.  Subcutaneous tissues were irrigated and bovied for hemostasis.  Skin was reapproximated with INSORB absorbable staples  with good cosmetic effect.  Good hemostasis was noted.  The catheters were secured at the skin level with LiquiBand and Steri-Strip to the skin.  A Tegaderm was placed over top these.  Each catheter was loaded with 5 mL of 0.5% Marcaine.  There were no complications. The patient tolerated the procedure well.  ESTIMATED BLOOD LOSS:  400 mL.  INTRAOPERATIVE FLUIDS:  900 mL.  The patient was taken to recovery in good condition.          ______________________________ Laverta Baltimore,  MD     TS/MEDQ  D:  02/16/2015  T:  02/17/2015  Job:  [id number]

## 2015-02-17 NOTE — Discharge Summary (Signed)
Physician Obstetric Discharge Summary  Patient ID: LATONJA BRESSAN MRN: [id number] DOB/AGE: 1980/06/18 35 y.o.   Date of Admission: 02/16/2015  Date of Discharge: 1/ 09/17  Admitting Diagnosis: Term pregnancy for RCS   Secondary Diagnosis: none  Mode of Delivery: repeat cesarean section     Discharge Diagnosis: Term pregnancy with RCS   Intrapartum Procedures: none   Post partum procedures: none  Complications: none   Brief Hospital Course     POD#3(Cesarean Section): SNOW SHUFF is a G2P1 who underwent cesarean section on 02/16/2015.  Patient had an uncomplicated surgery; for further details of this surgery, please refer to the operative note.  Patient had an uncomplicated postpartum course.  By time of discharge on POD#3, her pain was controlled on oral pain medications; she had appropriate lochia and was ambulating, voiding without difficulty, tolerating regular diet and passing flatus.   She was deemed stable for discharge to home.    Labs: CBC Latest Ref Rng 02/17/2015 02/15/2015 01/30/2014  WBC 3.6 - 11.0 K/uL 11.3(H) 7.8 7.8  Hemoglobin 12.0 - 16.0 g/dL 11.0(L) 12.1 12.2  Hematocrit 35.0 - 47.0 % 32.7(L) 35.8 37.2  Platelets 150 - 440 K/uL 144(L) 160 258.0   A NEG  Physical exam:  Blood pressure 101/62, pulse 86, temperature 98.7 F (37.1 C), temperature source Oral, resp. rate 16, height 5\' 4"  (1.626 m), weight 177 lb (80.287 kg), SpO2 98 %, unknown if currently breastfeeding. General: alert and no distress Lochia: appropriate Abdomen: soft, NT Uterine Fundus: firm Incision: healing well, no significant drainage, no dehiscence, no significant erythema, Insorb intact Extremities: No evidence of DVT seen on physical exam. No lower extremity edema.  Discharge Instructions: Per After Visit Summary. Activity: Advance as tolerated. Pelvic rest for 6 weeks.  Also refer to Discharge Instructions Diet: Regular Medications:   Medication List    ASK your doctor  about these medications        ferrous sulfate 325 (65 FE) MG tablet  Take 325 mg by mouth daily with breakfast.     PRENATAL PLUS IRON 29-1 MG Tabs  Take 1 tab po daily       Outpatient follow up:  Postpartum contraception: condoms  Discharged Condition: stable  Discharged to: home   Newborn Data: Disposition:home with mother  Apgars: APGAR (1 MIN): 9   APGAR (5 MINS): 9   APGAR (10 MINS):    Baby Feeding: Breast  Catheryn Bacon, CNM 02/17/2015 11:01 AM

## 2015-02-17 NOTE — Anesthesia Postprocedure Evaluation (Signed)
Anesthesia Post Note  Patient: Kristie Woods  Procedure(s) Performed: Procedure(s) (LRB): CESAREAN SECTION (N/A)  Patient location during evaluation: [facility] Anesthesia Type: Spinal Level of consciousness: awake and alert and oriented Pain management: satisfactory to patient Vital Signs Assessment: post-procedure vital signs reviewed and stable Respiratory status: spontaneous breathing, nonlabored ventilation and respiratory function stable Cardiovascular status: blood pressure returned to baseline Postop Assessment: no headache Anesthetic complications: no    Last Vitals:  Filed Vitals:   02/17/15 0821 02/17/15 1256  BP: 101/62 98/56  Pulse: 86 90  Temp: 37.1 C 36.9 C  Resp: 16 16    Last Pain:  Filed Vitals:   02/17/15 1256  PainSc: 0-No pain                 Nedda Gains,  Latricia Heft

## 2015-02-18 MED ORDER — OXYCODONE-ACETAMINOPHEN 5-325 MG PO TABS: 1.0000 | ORAL_TABLET | ORAL | Status: DC | PRN

## 2015-02-18 MED ORDER — RHO D IMMUNE GLOBULIN 1500 UNIT/2ML IJ SOSY
300.0000 ug | PREFILLED_SYRINGE | Freq: Once | INTRAMUSCULAR | Status: AC
  Administered 2015-02-18: 300 ug via INTRAMUSCULAR
  Filled 2015-02-18: qty 2

## 2015-02-18 NOTE — Discharge Instructions (Signed)
Cesarean Delivery Cesarean delivery is the birth of a baby through a cut (incision) in the abdomen and womb (uterus).  LET [facility] CARE PROVIDER KNOW ABOUT:  All medicines you are taking, including vitamins, herbs, eye drops, creams, and over-the-counter medicines.  Previous problems you or members of your family have had with the use of anesthetics.  Any bleeding or blood clotting disorders you have.  Family history of blood clots or bleeding disorders.  Any history of deep vein thrombosis (DVT) or pulmonary embolism (PE).  Previous surgeries you have had.  Medical conditions you have.  Any allergies you have.  Complicationsinvolving the pregnancy. RISKS AND COMPLICATIONS  Generally, this is a safe procedure. However, as with any procedure, complications can occur. Possible complications include:  Bleeding.  Infection.  Blood clots.  Injury to surrounding organs.  Problems with anesthesia.  Injury to the baby. BEFORE THE PROCEDURE   You may be given an antacid medicine to drink. This will prevent acid contents in your stomach from going into your lungs if you vomit during the surgery.  You may be given an antibiotic medicine to prevent infection. PROCEDURE   To prevent infection of your incision:  Hair may be removed from your pubic area if it is near your incision.  The skin of your pubic area and lower abdomen will be cleaned with a germ-killing solution (antiseptic).  A tube (Foley catheter) will be placed in your bladder to drain your urine from your bladder into a bag. This keeps your bladder empty during surgery.  An IV tube will be placed in your vein.  You may be given medicine to numb the lower half of your body (regional anesthetic). If you were in labor, you may have already had an epidural in place which can be used in both labor and cesarean delivery. You may possibly be given medicine to make you sleep (general anesthetic) though this is not as  common.  Your heart rate and your baby's heart rate will be monitored.  An incision will be made in your abdomen that extends to your uterus. There are 2 basic kinds of incisions:  The horizontal (transverse) incision. Horizontal incisions are from side to side and are used for most routine cesarean deliveries.  The vertical incision. The vertical incision is from the top of the abdomen to the bottom and is less commonly used. It is often done for women who have a serious complication (extreme prematurity) or under emergency situations.  The horizontal and vertical incisions may both be used at the same time. However, this is very uncommon.  An incision is then made in your uterus to deliver the baby.  Your baby will be delivered.  Your health care provider may place the baby on your chest. It is important to keep the baby warm. Your health care provider will dry off the baby, place the baby directly on your bare skin, and cover the baby with warm, dry blankets.  Both incisions will be closed with absorbable stitches. AFTER THE PROCEDURE   If you were awake during the surgery, you will see your baby right away. If you were asleep, you will see your baby as soon as you are awake.  You may breastfeed your baby after surgery.  You may be able to get up and walk the same day as the surgery. If you need to stay in bed for a period of time, you will receive help to turn, cough, and take deep breaths after  surgery. This helps prevent lung problems such as pneumonia.  Do not get out of bed alone the first time after surgery. You will need help getting out of bed until you are able to do this by yourself.  You may be able to shower the day after your cesarean delivery. After the bandage (dressing) is taken off the incision site, a nurse will assist you to shower if you would like help.  You may be directed to take actions to help prevent blood clots in your legs. These may  include:  Walking shortly after surgery, with someone assisting you. Moving around after surgery helps to improve blood flow.  Wearing compression stockings or using different types of devices.  Taking medicines to thin your blood (anticoagulants) if you are at high risk for DVT or PE.  Save any blood clots that you pass from your vagina. If you pass a clot while on the toilet, do not flush it. Call for the nurse. Tell the nurse if you think you are bleeding too much or passing too many clots.  You will be given medicine for pain and nausea as needed. Let your health care providers know if you are hurting. You may also be given an antibiotic to prevent an infection.  Your IV tube will be taken out when you are drinking a reasonable amount of fluids. The Foley catheter is taken out when you are up and walking.  If your blood type is Rh negative and your baby's blood type is Rh positive, you will be given a shot of anti-D immune globulin. This shot prevents you from having Rh problems with a future pregnancy. You should get the shot even if you had your tubes tied (tubal ligation).  If you are allowed to take the baby for a walk, place the baby in the bassinet and push it.   This information is not intended to replace advice given to you by your health care provider. Make sure you discuss any questions you have with your health care provider.   Document Released: 01/27/2005 Document Revised: 10/18/2014 Document Reviewed: 09/24/2011 Elsevier Interactive Patient Education 2016 [employer].  Call your doctor for increased pain or vaginal bleeding, temperature above 100.4, depression, or concerns.  No strenuous activity or heavy lifting for 6 weeks.  No intercourse, tampons, douching, or enemas for 6 weeks.  No tub baths-showers only.  No driving for 2 weeks or while taking pain medications.  Continue prenatal vitamin and iron.  Keep incision clean and dry.  Call your doctor for incision concerns  including redness, swelling, bleeding or drainage, or if begins to come apart.  Increase calories and fluids while breastfeeding.

## 2015-02-18 NOTE — Progress Notes (Addendum)
  Subjective:   Feels well and breastfeeding well. PO well and voids well. No BM yet  Objective:  Blood pressure 101/71, pulse 81, temperature 98 F (36.7 C), temperature source Oral, resp. rate 18, height 5\' 4"  (1.626 m), weight 177 lb (80.287 kg), SpO2 99 %, unknown if currently breastfeeding.  General: NAD Pulmonary: no increased work of breathing, CTA bilat, no W/R/R. Heart: S1S2, RRR, No M/R/G.  Abdomen: non-distended, non-tender, fundus firm at level of umbilicus Incision: Intact, Insorb sutures intact. On=Q pump intact.  Extremities: no edema, no erythema, no tenderness  Results for orders placed or performed during the hospital encounter of 02/16/15 (from the past 72 hour(s))  Fetal screen     Status: None   Collection Time: 02/17/15  6:20 AM  Result Value Ref Range   Fetal Screen NEG   Rhogam injection     Status: None (Preliminary result)   Collection Time: 02/17/15  6:20 AM  Result Value Ref Range   Unit Number [id number]    Blood Component Type RHIG    Unit division 00    Status of Unit ALLOCATED    Transfusion Status OK TO TRANSFUSE   CBC     Status: Abnormal   Collection Time: 02/17/15  6:20 AM  Result Value Ref Range   WBC 11.3 (H) 3.6 - 11.0 K/uL   RBC 3.77 (L) 3.80 - 5.20 MIL/uL   Hemoglobin 11.0 (L) 12.0 - 16.0 g/dL   HCT 32.7 (L) 35.0 - 47.0 %   MCV 86.7 80.0 - 100.0 fL   MCH 29.0 26.0 - 34.0 pg   MCHC 33.5 32.0 - 36.0 g/dL   RDW 13.2 11.5 - 14.5 %   Platelets 144 (L) 150 - 440 K/uL     Assessment:   35 y.o. G2P1 postoperativeday # 2   Plan:  1) Acute blood loss anemia - hemodynamically stable and asymptomatic - po ferrous sulfate  2) --/--/A NEG (01/05 0841) /   / Varicella immune, Rubella immune,   3) TDAP status: UTD  4) Breast/Condoms  5) Disposition: DC in am, RX in computer

## 2015-02-19 LAB — RHOGAM INJECTION: UNIT DIVISION: 0

## 2015-02-19 MED ORDER — TRAMADOL HCL 50 MG PO TABS: 50.0000 mg | ORAL_TABLET | Freq: Four times a day (QID) | ORAL | Status: DC | PRN

## 2015-02-19 MED ORDER — DOCUSATE SODIUM 100 MG PO CAPS: 100.0000 mg | ORAL_CAPSULE | Freq: Two times a day (BID) | ORAL | Status: DC | PRN

## 2015-02-19 NOTE — Progress Notes (Signed)
Discharge instructions provided.  Pt and sig other verbalize understanding of all instructions and follow-up care.  Prescriptions and Incision Hygiene kit given.  Pt discharged to home with infant at 1455 on 02/19/15 via wheelchair by volunteer. Reed Breech, RN 02/19/2015 4:09 PM

## 2015-02-19 NOTE — Lactation Note (Signed)
This note was copied from the chart of [place]. Lactation Consultation Note  Patient Name: Kristie Woods Today's Date: 02/19/2015     Maternal Data   Mom has [employer] insurance, given Medela pump n style, pt has used this before, she states her milk is  Coming in and breastfeeding is going well, no nipple tenderness  Feeding Feeding Type: Breast Fed  [facility] Score/Interventions                      Lactation Tools Discussed/Used     Consult Status      Ferol Luz 02/19/2015, 1:38 PM

## 2015-02-19 NOTE — Progress Notes (Signed)
Pt states that she received TDaP and Influenza vaccines during pregnancy.  Declines vaccines at this time. Reed Breech, RN 02/19/2015 10:17 AM

## 2015-02-21 NOTE — Addendum Note (Signed)
Addendum  created 02/21/15 1447 by Rolla Plate, CRNA   Modules edited: Notes Section   Notes Section:  File: [id number]; Pend: [id number]

## 2015-02-21 NOTE — Anesthesia Post-op Follow-up Note (Signed)
  Anesthesia Pain Follow-up Note  Patient: Kristie Woods  Day #: 1 per MNooe note  Date of Follow-up: 02/17/15 Time: 2:43 PM  Last Vitals:  Filed Vitals:   02/19/15 0758 02/19/15 1152  BP: 108/69   Pulse: 72   Temp: 36.7 C 36.7 C  Resp: 18     Level of Consciousness: alert  Pain: none   Side Effects:None  Catheter Site Exam:clean  Plan: D/C from anesthesia care  Brantley Fling

## 2015-02-23 LAB — FETAL SCREEN: FETAL SCREEN: NEGATIVE

## 2015-03-14 ENCOUNTER — Other Ambulatory Visit: Payer: Self-pay | Admitting: Internal Medicine

## 2015-06-04 ENCOUNTER — Ambulatory Visit: Payer: Self-pay | Admitting: Physician Assistant

## 2015-06-04 ENCOUNTER — Encounter: Payer: Self-pay | Admitting: Physician Assistant

## 2015-06-04 VITALS — BP 94/70 | HR 78 | Temp 98.4°F

## 2015-06-04 DIAGNOSIS — J018 Other acute sinusitis: Secondary | ICD-10-CM

## 2015-06-04 MED ORDER — AMOXICILLIN 875 MG PO TABS: 875.0000 mg | ORAL_TABLET | Freq: Two times a day (BID) | ORAL | Status: DC

## 2015-06-04 NOTE — Progress Notes (Signed)
S: C/o runny nose and congestion for 7 days, hoarse voice; no fever, chills, cp/sob, v/d; mucus is green and thick, cough is sporadic, c/o of facial and dental pain. Breastfeeding but infant was recently put on amoxil for uri  Using otc meds:   O: PE: perrl eomi, normocephalic, tms dull, nasal mucosa red and swollen, throat injected, neck supple no lymph, lungs c t a, cv rrr, neuro intact, voice is hoarse  A:  Acute sinusitis   P: amoxil 875mg  bid x 10d, drink fluids, continue regular meds , use otc meds of choice, return if not improving in 5 days, return earlier if worsening

## 2015-07-16 ENCOUNTER — Ambulatory Visit: Payer: Self-pay | Admitting: Physician Assistant

## 2015-07-16 ENCOUNTER — Encounter: Payer: Self-pay | Admitting: Physician Assistant

## 2015-07-16 VITALS — BP 110/62 | HR 60 | Temp 98.5°F

## 2015-07-16 DIAGNOSIS — H9202 Otalgia, left ear: Secondary | ICD-10-CM

## 2015-07-16 DIAGNOSIS — M26609 Unspecified temporomandibular joint disorder, unspecified side: Secondary | ICD-10-CM

## 2015-07-16 NOTE — Progress Notes (Signed)
S: c/o left ear pain, is intermittent, sometimes is worse when tmj is sore, or at times will just have a sharp shooting pain, no known injury, no fever/chills, sx for few months, no drainage from ear  O: Vitals wnl, nad, left tmj with crepitus and popping, tm on left a little dull but no redness or swelling, lungs c t a, cv rrr  A: tmj, ear pain  P: buy mouth guard for tmj, f/u with ent

## 2015-10-01 ENCOUNTER — Encounter: Payer: Self-pay | Admitting: Physician Assistant

## 2015-10-01 ENCOUNTER — Ambulatory Visit: Payer: Self-pay | Admitting: Physician Assistant

## 2015-10-01 VITALS — BP 100/80 | HR 72 | Temp 98.7°F

## 2015-10-01 DIAGNOSIS — J0100 Acute maxillary sinusitis, unspecified: Secondary | ICD-10-CM

## 2015-10-01 MED ORDER — CEFDINIR 300 MG PO CAPS: 300.0000 mg | ORAL_CAPSULE | Freq: Two times a day (BID) | ORAL | 0 refills | Status: DC

## 2015-10-01 MED ORDER — FLUTICASONE PROPIONATE 50 MCG/ACT NA SUSP: 2.0000 | Freq: Every day | NASAL | 6 refills | Status: DC

## 2015-10-01 NOTE — Progress Notes (Signed)
S: C/o sinus pain and congestion for 3 days, no fever, chills, cp/sob, v/d; mucus is green and thick, cough is sporadic, c/o of facial and dental pain. Small child is also sick  Using otc meds:   O: PE: vitals wnl, nad, perrl eomi, normocephalic, tms dull, nasal mucosa red and swollen, throat injected, neck supple no lymph, lungs c t a, cv rrr, neuro intact  A:  Acute sinusitis   P: drink fluids, continue regular meds , use otc meds of choice, return if not improving in 5 days, return earlier if worsening , omnicef, flonase

## 2016-05-01 ENCOUNTER — Ambulatory Visit
Admission: RE | Admit: 2016-05-01 | Discharge: 2016-05-01 | Disposition: A | Discharge status: Home / Self Care | Payer: 59 | Source: Ambulatory Visit | Attending: Physician Assistant | Admitting: Physician Assistant

## 2016-05-01 ENCOUNTER — Ambulatory Visit: Payer: Self-pay | Admitting: Physician Assistant

## 2016-05-01 VITALS — BP 100/74 | HR 92 | Temp 99.2°F

## 2016-05-01 DIAGNOSIS — R1011 Right upper quadrant pain: Secondary | ICD-10-CM | POA: Diagnosis not present

## 2016-05-01 DIAGNOSIS — R079 Chest pain, unspecified: Secondary | ICD-10-CM | POA: Diagnosis not present

## 2016-05-01 DIAGNOSIS — R109 Unspecified abdominal pain: Secondary | ICD-10-CM

## 2016-05-01 DIAGNOSIS — R509 Fever, unspecified: Secondary | ICD-10-CM

## 2016-05-01 DIAGNOSIS — K7689 Other specified diseases of liver: Secondary | ICD-10-CM | POA: Diagnosis not present

## 2016-05-01 LAB — POCT URINALYSIS DIPSTICK
BILIRUBIN UA: NEGATIVE
GLUCOSE UA: NEGATIVE
Ketones, UA: NEGATIVE
LEUKOCYTES UA: NEGATIVE
NITRITE UA: NEGATIVE
Spec Grav, UA: 1.02 (ref 1.030–1.035)
UROBILINOGEN UA: 0.2 (ref ?–2.0)
pH, UA: 7 (ref 5.0–8.0)

## 2016-05-01 LAB — POCT INFLUENZA A/B
INFLUENZA A, POC: NEGATIVE
INFLUENZA B, POC: NEGATIVE

## 2016-05-01 NOTE — Progress Notes (Signed)
Conrtacted patient whom informed me that she was already over at Ultrasound dept and they are working her in.

## 2016-05-01 NOTE — Progress Notes (Addendum)
S: c/o r sided chest pain, started as pain in back of r shoulder, now radiates around to the front, no v/d, hx of kidney stones, is burping a lot, eating a high protein diet, hasn't been eating any carbs, no uti sx; states pain is dull, no diaphoresis with pain, no abd pain   O: vitals w low grade temp, ent wnl, neck supple no lymph, lungs c t a, cv rrr, abd soft nontender bs normal all 4 quads, n/v intact, ua trace blood, flu swab neg  A: nonspecific chest pain, fever  P: cxr, will do ruq [place] if cxr is negative cxr is normal, ordered US ruq stat

## 2016-05-01 NOTE — Progress Notes (Signed)
LM message informing patient that Gallbladder is normal and beign cyst on liver area need to follow up with primary provider

## 2016-05-01 NOTE — Addendum Note (Signed)
Addended by: Versie Starks on: 05/01/2016 10:39 AM   Modules accepted: Orders

## 2016-09-30 DIAGNOSIS — Z86018 Personal history of other benign neoplasm: Secondary | ICD-10-CM | POA: Diagnosis not present

## 2016-09-30 DIAGNOSIS — D485 Neoplasm of uncertain behavior of skin: Secondary | ICD-10-CM | POA: Diagnosis not present

## 2016-09-30 DIAGNOSIS — D229 Melanocytic nevi, unspecified: Secondary | ICD-10-CM | POA: Diagnosis not present

## 2017-02-26 ENCOUNTER — Ambulatory Visit (INDEPENDENT_AMBULATORY_CARE_PROVIDER_SITE_OTHER): Payer: 59 | Admitting: Family Medicine

## 2017-02-26 ENCOUNTER — Other Ambulatory Visit: Payer: Self-pay

## 2017-02-26 ENCOUNTER — Encounter: Payer: Self-pay | Admitting: Family Medicine

## 2017-02-26 DIAGNOSIS — F40243 Fear of flying: Secondary | ICD-10-CM | POA: Diagnosis not present

## 2017-02-26 DIAGNOSIS — Z0000 Encounter for general adult medical examination without abnormal findings: Secondary | ICD-10-CM

## 2017-02-26 MED ORDER — ALPRAZOLAM 0.25 MG PO TABS: 0.2500 mg | ORAL_TABLET | Freq: Once | ORAL | 0 refills | Status: DC | PRN

## 2017-02-26 NOTE — Progress Notes (Signed)
Tommi Rumps, MD Phone: [phone]  Kristie Woods is a 36 y.o. female who presents today for physical exam.  Plays soccer 3 times a week. Eats very healthfully.  Lost 40 pounds purposefully with dietary changes and exercise. Pap smears and breast exams through gynecology. HIV up-to-date. Tetanus vaccination and flu vaccination up-to-date. No family history of colon cancer, breast cancer, or gynecologic cancers.  Maternal grandfather had lung cancer though he smoked. Father had a stroke in his 28s and several MIs.  Died in his 75s. Occasional tobacco use.  3 alcoholic beverages a week.  No illicit drug use. Does have some anxiety with flying.  She was on Xanax previously for this.  Rarely takes it.  Needs a refill.  Active Ambulatory Problems    Diagnosis Date Noted  . Migraine 09/23/2012  . Routine general medical examination at a health care facility 01/17/2014  . H/O cesarean section complicating pregnancy 61/60/7371  . Post-operative state 02/16/2015  . 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   Resolved Ambulatory Problems    Diagnosis Date Noted  . Abdominal pain, lower 09/23/2012  . Flank pain, acute 12/06/2012   Past Medical History:  Diagnosis Date  . Anemia   . GERD (gastroesophageal reflux disease)   . H/O colposcopy with cervical biopsy 2009  . Hyperlipidemia   . Kidney stones 2011  . Migraine     Family History  Problem Relation Age of Onset  . Hyperlipidemia Mother   . Thyroid disease Mother   . Heart disease Father   . Stroke Father 41  . Hyperlipidemia Father   . Crohn's disease Brother   . Alcohol abuse Maternal Grandmother   . Ulcerative colitis Brother     Social History   Socioeconomic History  . Marital status: Married    Spouse name: Not on file  . Number of children: Not on file  . Years of education: Not on file  . Highest education level: Not on file  Social Needs  . Financial resource strain: Not on file  . Food insecurity - worry: Not  on file  . Food insecurity - inability: Not on file  . Transportation needs - medical: Not on file  . Transportation needs - non-medical: Not on file  Occupational History  . Not on file  Tobacco Use  . Smoking status: Current Some Day Smoker    Packs/day: 0.25    Years: 10.00    Pack years: 2.50    Last attempt to quit: 01/15/2015    Years since quitting: 2.1  . Smokeless tobacco: Never Used  Substance and Sexual Activity  . Alcohol use: Yes    Comment: Occassionally  . Drug use: No  . Sexual activity: Yes  Other Topics Concern  . 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 CV:  Negative for chest pain, dyspnea, edema, palpitations Resp: Negative for cough, dyspnea, hemoptysis GI: Negative for nausea, vomiting, diarrhea, constipation, abdominal pain, melena, hematochezia. GU: Negative for dysuria, incontinence, urinary hesitance,  hematuria, vaginal or penile discharge, polyuria, sexual difficulty, lumps in testicle or breasts MSK: Negative for muscle cramps or aches, joint pain or swelling Neuro: Negative for headaches, weakness, numbness, dizziness, passing out/fainting Psych: Negative for depression, anxiety, memory problems  Objective  Physical Exam Vitals:   02/26/17 1050  BP: 102/70  Pulse: 64  Temp: 98 F (36.7 C)  SpO2: 94%    BP Readings from Last 3 Encounters:  02/26/17 102/70  05/01/16 100/74  10/01/15 100/80   Wt Readings from Last 3 Encounters:  02/26/17 130 lb 3.2 oz (59.1 kg)  02/16/15 177 lb (80.3 kg)  02/15/15 177 lb (80.3 kg)    Physical  Exam  Constitutional: No distress.  HENT:  Head: Normocephalic and atraumatic.  Mouth/Throat: Oropharynx is clear and moist.  Eyes: Conjunctivae are normal. Pupils are equal, round, and reactive to light.  Neck: Neck supple.  Cardiovascular: Normal rate, regular rhythm and normal heart sounds.  Pulmonary/Chest: Effort normal and breath sounds normal.  Abdominal: Soft. Bowel sounds are normal. She exhibits no distension. There is no tenderness. There is no rebound and no guarding.  Musculoskeletal: She exhibits no edema.  Lymphadenopathy:    She has no cervical adenopathy.  Neurological: She is alert. Gait normal.  Skin: Skin is warm and dry. She is not diaphoretic.  Psychiatric: Mood and affect normal.     Assessment/Plan:   Routine general medical examination at a health care facility Physical exam completed.  Encouraged continued healthy diet and exercise.  She will contact her gynecologist for Pap smear and breast exam.  Encouraged to quit smoking.  She had labs done late last year and she will get [place] a copy of these.  She reports potentially LFTs were slightly elevated.  May need to repeat these.  Fear of flying Patient with anxiety when flying.  She has tolerated Xanax in the past.  Low dose given.   No orders of the defined types were placed in this encounter.   Meds ordered this encounter  Medications  . ALPRAZolam (XANAX) 0.25 MG tablet    Sig: Take 1 tablet (0.25 mg total) by mouth once as needed for up to 1 dose for anxiety (related to flying).    Dispense:  10 tablet    Refill:  0     Tommi Rumps, MD [place]

## 2017-02-26 NOTE — Patient Instructions (Signed)
Nice to meet you. Please set up a physical with your gynecologist. Please send [place] your lab results. Please continue to work on diet and exercise.

## 2017-02-26 NOTE — Assessment & Plan Note (Signed)
Physical exam completed.  Encouraged continued healthy diet and exercise.  She will contact her gynecologist for Pap smear and breast exam.  Encouraged to quit smoking.  She had labs done late last year and she will get [place] a copy of these.  She reports potentially LFTs were slightly elevated.  May need to repeat these.

## 2017-02-26 NOTE — Assessment & Plan Note (Addendum)
Patient with anxiety when flying.  She has tolerated Xanax in the past.  Low dose given.

## 2017-03-23 DIAGNOSIS — R590 Localized enlarged lymph nodes: Secondary | ICD-10-CM | POA: Diagnosis not present

## 2017-03-23 DIAGNOSIS — Z01411 Encounter for gynecological examination (general) (routine) with abnormal findings: Secondary | ICD-10-CM | POA: Diagnosis not present

## 2017-03-23 DIAGNOSIS — Z124 Encounter for screening for malignant neoplasm of cervix: Secondary | ICD-10-CM | POA: Diagnosis not present

## 2017-03-23 DIAGNOSIS — N920 Excessive and frequent menstruation with regular cycle: Secondary | ICD-10-CM | POA: Diagnosis not present

## 2017-03-23 DIAGNOSIS — M25521 Pain in right elbow: Secondary | ICD-10-CM | POA: Diagnosis not present

## 2017-03-23 DIAGNOSIS — M25522 Pain in left elbow: Secondary | ICD-10-CM | POA: Diagnosis not present

## 2017-10-13 DIAGNOSIS — L578 Other skin changes due to chronic exposure to nonionizing radiation: Secondary | ICD-10-CM | POA: Diagnosis not present

## 2017-10-13 DIAGNOSIS — Z86018 Personal history of other benign neoplasm: Secondary | ICD-10-CM | POA: Diagnosis not present

## 2017-10-13 DIAGNOSIS — D492 Neoplasm of unspecified behavior of bone, soft tissue, and skin: Secondary | ICD-10-CM | POA: Diagnosis not present

## 2017-10-13 DIAGNOSIS — Z1283 Encounter for screening for malignant neoplasm of skin: Secondary | ICD-10-CM | POA: Diagnosis not present

## 2017-12-23 ENCOUNTER — Other Ambulatory Visit: Payer: Self-pay | Admitting: Obstetrics and Gynecology

## 2017-12-23 DIAGNOSIS — Z1231 Encounter for screening mammogram for malignant neoplasm of breast: Secondary | ICD-10-CM

## 2017-12-30 ENCOUNTER — Ambulatory Visit
Admission: RE | Admit: 2017-12-30 | Discharge: 2017-12-30 | Disposition: A | Discharge status: Home / Self Care | Payer: 59 | Source: Ambulatory Visit | Attending: Obstetrics and Gynecology | Admitting: Obstetrics and Gynecology

## 2017-12-30 DIAGNOSIS — Z1231 Encounter for screening mammogram for malignant neoplasm of breast: Secondary | ICD-10-CM | POA: Diagnosis not present

## 2018-02-22 ENCOUNTER — Other Ambulatory Visit: Payer: Self-pay | Admitting: Family Medicine

## 2018-02-24 NOTE — Telephone Encounter (Signed)
Refilled: 02/26/2017 Last OV: 02/26/2017 Next OV: 02/26/2018

## 2018-02-24 NOTE — Telephone Encounter (Signed)
Patient has a visit later this week. We will discuss a refill then.

## 2018-02-26 ENCOUNTER — Encounter: Payer: Self-pay | Admitting: Family Medicine

## 2018-02-26 ENCOUNTER — Ambulatory Visit (INDEPENDENT_AMBULATORY_CARE_PROVIDER_SITE_OTHER): Payer: No Typology Code available for payment source | Admitting: Family Medicine

## 2018-02-26 VITALS — BP 100/70 | HR 81 | Temp 98.5°F | Ht 64.0 in | Wt 142.5 lb

## 2018-02-26 DIAGNOSIS — Z13220 Encounter for screening for lipoid disorders: Secondary | ICD-10-CM

## 2018-02-26 DIAGNOSIS — Z1329 Encounter for screening for other suspected endocrine disorder: Secondary | ICD-10-CM | POA: Diagnosis not present

## 2018-02-26 DIAGNOSIS — Z130 Encounter for screening for diseases of the blood and blood-forming organs and certain disorders involving the immune mechanism: Secondary | ICD-10-CM | POA: Diagnosis not present

## 2018-02-26 DIAGNOSIS — Z0000 Encounter for general adult medical examination without abnormal findings: Secondary | ICD-10-CM

## 2018-02-26 DIAGNOSIS — R9389 Abnormal findings on diagnostic imaging of other specified body structures: Secondary | ICD-10-CM

## 2018-02-26 DIAGNOSIS — F40243 Fear of flying: Secondary | ICD-10-CM

## 2018-02-26 LAB — COMPREHENSIVE METABOLIC PANEL
ALT: 9 U/L (ref 0–35)
AST: 13 U/L (ref 0–37)
Albumin: 4.6 g/dL (ref 3.5–5.2)
Alkaline Phosphatase: 48 U/L (ref 39–117)
BILIRUBIN TOTAL: 1 mg/dL (ref 0.2–1.2)
BUN: 18 mg/dL (ref 6–23)
CALCIUM: 9.7 mg/dL (ref 8.4–10.5)
CHLORIDE: 104 meq/L (ref 96–112)
CO2: 25 meq/L (ref 19–32)
CREATININE: 0.94 mg/dL (ref 0.40–1.20)
GFR: 66.5 mL/min (ref >60.00)
GLUCOSE: 82 mg/dL (ref 70–99)
Potassium: 4.2 mEq/L (ref 3.5–5.1)
Sodium: 137 mEq/L (ref 135–145)
Total Protein: 7.4 g/dL (ref 6.0–8.3)

## 2018-02-26 LAB — LIPID PANEL
CHOL/HDL RATIO: 3
Cholesterol: 225 mg/dL — ABNORMAL HIGH (ref 0–200)
HDL: 77 mg/dL (ref >39.00)
LDL CALC: 135 mg/dL — AB (ref 0–99)
NONHDL: 148.14
TRIGLYCERIDES: 67 mg/dL (ref 0.0–149.0)
VLDL: 13.4 mg/dL (ref 0.0–40.0)

## 2018-02-26 LAB — TSH: TSH: 2.31 u[IU]/mL (ref 0.35–4.50)

## 2018-02-26 LAB — CBC
HCT: 39.6 % (ref 36.0–46.0)
Hemoglobin: 13.5 g/dL (ref 12.0–15.0)
MCHC: 34.2 g/dL (ref 30.0–36.0)
MCV: 86.8 fl (ref 78.0–100.0)
PLATELETS: 278 10*3/uL (ref 150.0–400.0)
RBC: 4.56 Mil/uL (ref 3.87–5.11)
RDW: 12.1 % (ref 11.5–15.5)
WBC: 5 10*3/uL (ref 4.0–10.5)

## 2018-02-26 MED ORDER — ALPRAZOLAM 0.25 MG PO TABS: 0.2500 mg | ORAL_TABLET | Freq: Once | ORAL | 0 refills | Status: DC | PRN

## 2018-02-26 NOTE — Assessment & Plan Note (Signed)
Physical exam completed.  Encouraged continued dietary changes and exercise.  She will continue to see GYN for pelvic and breast exams.  Lab work as outlined below.

## 2018-02-26 NOTE — Assessment & Plan Note (Addendum)
Abnormal brain MRI.  Based on patient description it sounds as though these are likely related to her migraines.  Patient already has an appointment with neurology.  Referral placed.

## 2018-02-26 NOTE — Patient Instructions (Addendum)
Nice to see you. We will get lab work today and contact you with the results. Please see neurology as planned. Please continue with your healthy diet and exercise.

## 2018-02-26 NOTE — Progress Notes (Signed)
Tommi Rumps, MD Phone: [phone]  Kristie Woods is a 38 y.o. female who presents today for CPE.  Exercises by playing soccer 3 days a week.  She did join a gym recently. Diet: This got off track for about 5 months.  She is back on track now and is eating quite healthfully. She sees gynecology for Pap smears.  She has a menstrual cycle every 28 days.  Is heavy for 2 days and then lasts for up to 7 to 10 days.  Her gynecologist is aware of this.  She is sexually active with one partner. No family history of breast cancer, ovarian cancer, or colon cancer. Had HIV screening with her children. Up-to-date on tetanus vaccination and flu vaccination. She does use tobacco socially with smoking.  She is going to stop this.  3 alcoholic beverages a week.  No illicit drugs. She sees a Pharmacist, community twice a year.  Ophthalmologist every other year. Does note some stress related to work.  She plays soccer and does fun activities with her family.  These things help with the stress.  No anxiety or depression.  She does have anxiety related to flying.  She takes a Xanax about an hour before getting on the plane and that is helpful.  She does not drink alcohol with this.  She is going to [place] this summer.  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  She spoke with neurology at the hospital regarding this and states that is what they felt they were related to the they did recommend she see an outpatient neurologist.  She has no symptoms other than possible head twitching that could be related to stress.  She does have a history of migraines.  She already has an appointment with neurology and needs a referral.  Active Ambulatory Problems    Diagnosis Date Noted  . Migraine 09/23/2012  . Routine general medical examination at a health care facility  01/17/2014  . H/O cesarean section complicating pregnancy 23/55/7322  . Post-operative state 02/16/2015  . 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 Abnormal MRI 02/26/2018   Resolved Ambulatory Problems    Diagnosis Date Noted  . Abdominal pain, lower 09/23/2012  . Flank pain, acute 12/06/2012   Past Medical History:  Diagnosis Date  . Anemia   . GERD (gastroesophageal reflux disease)   . H/O colposcopy with cervical biopsy 2009  . Hyperlipidemia   . Kidney stones 2011    Family History  Problem Relation Age of Onset  . Hyperlipidemia Mother   . Thyroid disease Mother   . Heart disease Father   . Stroke Father 16  . Hyperlipidemia Father   . Crohn's disease Brother   . Alcohol abuse Maternal Grandmother   . Ulcerative colitis Brother     Social History   Socioeconomic History  . Marital status: Married    Spouse name: Not on file  . Number of children: Not on file  . Years of education: Not on file  . Highest education level: Not on file  Occupational History  . Not on file  Social Needs  . Financial resource strain: Not on file  . Food insecurity:    Worry: Not on file    Inability: Not on file  . Transportation needs:    Medical: Not on file  Non-medical: Not on file  Tobacco Use  . Smoking status: Current Some Day Smoker    Packs/day: 0.25    Years: 10.00    Pack years: 2.50    Last attempt to quit: 01/15/2015    Years since quitting: 3.1  . Smokeless tobacco: Never Used  Substance and Sexual Activity  . Alcohol use: Yes    Comment: Occassionally  . Drug use: No  . Sexual activity: Yes  Lifestyle  . Physical activity:    Days per week: Not on file    Minutes per session: Not on file  . Stress: Not on file  Relationships  . Social connections:    Talks on phone: Not on file    Gets together: Not on file    Attends religious service: Not on file    Active member of club or organization: Not on file    Attends meetings of clubs or organizations: Not on  file    Relationship status: Not on file  . Intimate partner violence:    Fear of current or ex partner: Not on file    Emotionally abused: Not on file    Physically abused: Not on file    Forced sexual activity: Not on file  Other Topics Concern  . 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 CV:  Negative for chest pain, dyspnea, edema, palpitations Resp: Negative for cough, dyspnea, hemoptysis GI: Negative for nausea, vomiting, diarrhea, constipation, abdominal pain, melena, hematochezia. GU: Negative for dysuria, incontinence, urinary hesitance, hematuria, vaginal or penile discharge, polyuria, sexual difficulty, lumps in testicle or breasts MSK: Negative for muscle cramps or aches, joint pain or swelling Neuro: Negative for headaches, weakness, numbness, dizziness, passing out/fainting Psych: Negative for depression, anxiety, memory problems  Objective  Physical Exam Vitals:   02/26/18 0831  BP: 100/70  Pulse: 81  Temp: 98.5 F (36.9 C)  SpO2: 99%    BP Readings from Last 3 Encounters:  02/26/18 100/70  02/26/17 102/70  05/01/16 100/74   Wt Readings from Last 3 Encounters:  02/26/18 142 lb 8 oz (64.6 kg)  02/26/17 130 lb 3.2 oz (59.1 kg)  02/16/15 177 lb (80.3 kg)    Physical Exam Constitutional:      General: She is not in acute distress.    Appearance: She is not diaphoretic.  HENT:     Head: Normocephalic and atraumatic.     Mouth/Throat:     Mouth: Mucous membranes are moist.     Pharynx: Oropharynx is clear.  Eyes:     Conjunctiva/sclera:  Conjunctivae normal.     Pupils: Pupils are equal, round, and reactive to light.  Cardiovascular:     Rate and Rhythm: Normal rate and regular rhythm.     Heart sounds: Normal heart sounds.  Pulmonary:     Effort: Pulmonary effort is normal.     Breath sounds: Normal breath sounds.  Abdominal:     General: Bowel sounds are normal. There is no distension.  Palpations: Abdomen is soft.     Tenderness: There is no abdominal tenderness. There is no guarding or rebound.  Skin:    General: Skin is warm and dry.  Neurological:     Mental Status: She is alert.     Comments: CN 2-12 intact, 5/5 strength in bilateral biceps, triceps, grip, quads, hamstrings, plantar and dorsiflexion, sensation to light touch intact in bilateral UE and LE, normal gait, 2+ patellar reflexes  Psychiatric:        Mood and Affect: Mood normal.      Assessment/Plan:   Routine general medical examination at a health care facility Physical exam completed.  Encouraged continued dietary changes and exercise.  She will continue to see GYN for pelvic and breast exams.  Lab work as outlined below.  Fear of flying Refill of Xanax given.  Controlled substance database reviewed.  Abnormal MRI Abnormal brain MRI.  Based on patient description it sounds as though these are likely related to her migraines.  Patient already has an appointment with neurology.  Referral placed.   Orders Placed This Encounter  Procedures  . Comp Met (CMET)  . Lipid panel  . CBC  . TSH  . Ambulatory referral to Neurology    Referral Priority:   Routine    Referral Type:   Consultation    Referral Reason:   Specialty Services Required    Requested Specialty:   Neurology    Number of Visits Requested:   1    Meds ordered this encounter  Medications  . ALPRAZolam (XANAX) 0.25 MG tablet    Sig: Take 1 tablet (0.25 mg total) by mouth once as needed for up to 1 dose for anxiety (related to flying).    Dispense:  10 tablet    Refill:   0     Tommi Rumps, MD [place]

## 2018-02-26 NOTE — Assessment & Plan Note (Signed)
Refill of Xanax given.  Controlled substance database reviewed.

## 2018-04-06 ENCOUNTER — Other Ambulatory Visit: Payer: Self-pay | Admitting: Neurology

## 2018-04-06 DIAGNOSIS — G44229 Chronic tension-type headache, not intractable: Secondary | ICD-10-CM

## 2018-04-12 ENCOUNTER — Other Ambulatory Visit: Payer: Self-pay

## 2018-04-12 ENCOUNTER — Ambulatory Visit
Admission: RE | Admit: 2018-04-12 | Discharge: 2018-04-12 | Disposition: A | Discharge status: Home / Self Care | Payer: No Typology Code available for payment source | Source: Ambulatory Visit | Attending: Neurology | Admitting: Neurology

## 2018-04-12 DIAGNOSIS — G44229 Chronic tension-type headache, not intractable: Secondary | ICD-10-CM | POA: Insufficient documentation

## 2018-04-12 MED ORDER — GADOBUTROL 1 MMOL/ML IV SOLN
6.0000 mL | Freq: Once | INTRAVENOUS | Status: AC | PRN
  Administered 2018-04-12: 6 mL via INTRAVENOUS

## 2019-03-02 ENCOUNTER — Encounter: Payer: Self-pay | Admitting: Family Medicine

## 2019-03-02 ENCOUNTER — Other Ambulatory Visit: Payer: Self-pay

## 2019-03-02 ENCOUNTER — Ambulatory Visit (INDEPENDENT_AMBULATORY_CARE_PROVIDER_SITE_OTHER): Payer: No Typology Code available for payment source | Admitting: Family Medicine

## 2019-03-02 VITALS — BP 115/80 | HR 65 | Temp 97.3°F | Ht 64.6 in | Wt 141.6 lb

## 2019-03-02 DIAGNOSIS — E559 Vitamin D deficiency, unspecified: Secondary | ICD-10-CM | POA: Diagnosis not present

## 2019-03-02 DIAGNOSIS — Z13220 Encounter for screening for lipoid disorders: Secondary | ICD-10-CM

## 2019-03-02 DIAGNOSIS — Z0000 Encounter for general adult medical examination without abnormal findings: Secondary | ICD-10-CM | POA: Diagnosis not present

## 2019-03-02 LAB — LIPID PANEL
Cholesterol: 199 mg/dL (ref 0–200)
HDL: 78.8 mg/dL (ref >39.00)
LDL Cholesterol: 107 mg/dL — ABNORMAL HIGH (ref 0–99)
NonHDL: 120.11
Total CHOL/HDL Ratio: 3
Triglycerides: 64 mg/dL (ref 0.0–149.0)
VLDL: 12.8 mg/dL (ref 0.0–40.0)

## 2019-03-02 LAB — COMPREHENSIVE METABOLIC PANEL
ALT: 9 U/L (ref 0–35)
AST: 14 U/L (ref 0–37)
Albumin: 4.3 g/dL (ref 3.5–5.2)
Alkaline Phosphatase: 56 U/L (ref 39–117)
BUN: 22 mg/dL (ref 6–23)
CO2: 28 mEq/L (ref 19–32)
Calcium: 9.4 mg/dL (ref 8.4–10.5)
Chloride: 103 mEq/L (ref 96–112)
Creatinine, Ser: 0.86 mg/dL (ref 0.40–1.20)
GFR: 73.3 mL/min (ref >60.00)
Glucose, Bld: 93 mg/dL (ref 70–99)
Potassium: 3.8 mEq/L (ref 3.5–5.1)
Sodium: 136 mEq/L (ref 135–145)
Total Bilirubin: 0.8 mg/dL (ref 0.2–1.2)
Total Protein: 6.9 g/dL (ref 6.0–8.3)

## 2019-03-02 LAB — VITAMIN D 25 HYDROXY (VIT D DEFICIENCY, FRACTURES): VITD: 38.73 ng/mL (ref 30.00–100.00)

## 2019-03-02 NOTE — Progress Notes (Signed)
Tommi Rumps, MD Phone: [phone]  Kristie Woods is a 39 y.o. female who presents today for a CPE.  Diet: Diet is okay.  She gets some fruits and vegetables.  Mostly lean meats.  Rare sweet tea.  Occasional soda.  Drinks lots of water. Exercise: Goes to gym 3 days a week and plays soccer 3 days a week. Pap smear: UTD NILM and negative HPV 03/25/17 through GYN Family history-  Breast cancer: No  Ovarian cancer: No  Colon cancer: No first-degree relatives Menses: Once every 4 weeks lasting for 10 days.  The first 2 to 3 days are heavy.  She has discussed this with GYN and they are considering ablation. Sexually active: With one partner who is her husband. Vaccines-   Flu: Up-to-date  Tetanus: UTD  COVID19: Got her first vaccine on Monday. HIV screening: UTD Tobacco use: Social cigarette use. Alcohol use: Does not drink during the week.  Has 2 to 3 glasses of wine over the weekend. Illicit Drug use: No. Dentist: Yes. Ophthalmology: Yes. Does have vitamin D deficiency.  She is taking at 1000 international units daily.   Active Ambulatory Problems    Diagnosis Date Noted  . Migraine 09/23/2012  . Routine general medical examination at a health care facility 01/17/2014  . H/O cesarean section complicating pregnancy 65/78/4696  . Post-operative state 02/16/2015  . 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 Abnormal MRI 02/26/2018   Resolved Ambulatory Problems    Diagnosis Date Noted  . Abdominal pain, lower 09/23/2012  . Flank pain, acute 12/06/2012   Past Medical History:  Diagnosis Date  . Anemia   . GERD (gastroesophageal reflux disease)   . H/O colposcopy with cervical biopsy 2009  . Hyperlipidemia   . Kidney stones 2011    Family History  Problem Relation Age of Onset  . Hyperlipidemia Mother   . Thyroid disease Mother   . Heart disease Father   . Stroke Father 29  . Hyperlipidemia Father   . Crohn's disease Brother   . Alcohol abuse Maternal Grandmother   .  Ulcerative colitis Brother     Social History   Socioeconomic History  . Marital status: Married    Spouse name: Not on file  . Number of children: Not on file  . Years of education: Not on file  . Highest education level: Not on file  Occupational History  . Not on file  Tobacco Use  . Smoking status: Current Some Day Smoker    Packs/day: 0.25    Years: 10.00    Pack years: 2.50    Last attempt to quit: 01/15/2015    Years since quitting: 4.1  . Smokeless tobacco: Never Used  Substance and Sexual Activity  . Alcohol use: Yes    Comment: Occassionally  . Drug use: No  . Sexual activity: Yes  Other Topics Concern  . 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 Difficulty of Paying Living Expenses: Not on file  Food Insecurity:   . Worried About RCharity fundraiserin the Last Year: Not on file  . Ran Out of Food in the Last Year: Not  on file  Transportation Needs:   . Lack of Transportation (Medical): Not on file  . Lack of Transportation (Non-Medical): Not on file  Physical Activity:   . Days of Exercise per Week: Not on file  . Minutes of Exercise per Session: Not on file  Stress:   . Feeling of Stress : Not on file  Social Connections:   . Frequency of Communication with Friends and Family: Not on file  . Frequency of Social Gatherings with Friends and Family: Not on file  . Attends Religious Services: Not on file  . Active Member of Clubs or Organizations: Not on file  . Attends Archivist Meetings: Not on file  . Marital Status: Not on file  Intimate Partner Violence:   . Fear of Current or Ex-Partner: Not on file  . Emotionally Abused: Not on file  . Physically Abused: Not on file  . Sexually Abused: Not on file     ROS  General:  Negative for nexplained weight loss, fever Skin: Negative for new or changing mole, sore that won't heal HEENT: Negative for trouble hearing, trouble seeing, ringing in ears, mouth sores, hoarseness, change in voice, dysphagia. CV:  Negative for chest pain, dyspnea, edema, palpitations Resp: Negative for cough, dyspnea, hemoptysis GI: Negative for nausea, vomiting, diarrhea, constipation, abdominal pain, melena, hematochezia. GU: Negative for dysuria, incontinence, urinary hesitance, hematuria, vaginal or penile discharge, polyuria, sexual difficulty, lumps in testicle or breasts MSK: Negative for muscle cramps or aches, joint pain or swelling Neuro: Negative for headaches, weakness, numbness, dizziness, passing out/fainting Psych: Negative for depression, anxiety, memory problems  Objective  Physical Exam Vitals:   03/02/19 0904  BP: 115/80  Pulse: 65  Temp: (!) 97.3 F (36.3 C)  SpO2: 98%    BP Readings from Last 3 Encounters:  03/02/19 115/80  02/26/18 100/70  02/26/17 102/70   Wt Readings from Last 3 Encounters:  03/02/19 141 lb 9.6 oz (64.2 kg)  02/26/18 142 lb 8 oz (64.6 kg)  02/26/17 130 lb 3.2 oz (59.1 kg)    Physical Exam Constitutional:      General: She is not in acute distress.    Appearance: She is not diaphoretic.  HENT:     Head: Normocephalic and atraumatic.  Eyes:     Conjunctiva/sclera: Conjunctivae normal.     Pupils: Pupils are equal, round, and reactive to light.  Cardiovascular:     Rate and Rhythm: Normal rate and regular rhythm.     Heart sounds: Normal heart sounds.  Pulmonary:     Effort: Pulmonary effort is normal.     Breath sounds: Normal breath sounds.  Abdominal:     General: Bowel sounds are normal. There is no distension.     Palpations: Abdomen is soft.     Tenderness: There is no abdominal tenderness. There is no guarding or rebound.  Musculoskeletal:     Right lower leg: No edema.     Left lower leg: No  edema.  Lymphadenopathy:     Cervical: No cervical adenopathy.  Skin:    General: Skin is warm and dry.  Neurological:     Mental Status: She is alert.  Psychiatric:        Mood and Affect: Mood normal.      Assessment/Plan:   Routine general medical examination at a health care facility Physical exam completed.  She will continue healthy diet and exercise.  She will follow-up with her gynecologist regarding prolonged menstrual cycles and for  yearly exam with them.  She will get the second COVID-19 vaccine when she is due.  Discussed tobacco cessation and discussed risk of continued smoking.  Lab work as outlined below.   Orders Placed This Encounter  Procedures  . Vitamin D (25 hydroxy)  . Comp Met (CMET)  . Lipid panel    No orders of the defined types were placed in this encounter.   This visit occurred during the SARS-CoV-2 public health emergency.  Safety protocols were in place, including screening questions prior to the visit, additional usage of staff PPE, and extensive cleaning of exam room while observing appropriate contact time as indicated for disinfecting solutions.    Tommi Rumps, MD [place]

## 2019-03-02 NOTE — Patient Instructions (Signed)
Nice to see you. Please continue with diet and exercise. Please contact your gynecologist for yearly follow-up. We will contact you with your lab results.

## 2019-03-02 NOTE — Assessment & Plan Note (Signed)
Physical exam completed.  She will continue healthy diet and exercise.  She will follow-up with her gynecologist regarding prolonged menstrual cycles and for yearly exam with them.  She will get the second COVID-19 vaccine when she is due.  Discussed tobacco cessation and discussed risk of continued smoking.  Lab work as outlined below.

## 2019-12-13 ENCOUNTER — Other Ambulatory Visit: Payer: Self-pay | Admitting: Family Medicine

## 2019-12-14 ENCOUNTER — Telehealth: Payer: No Typology Code available for payment source | Admitting: Family Medicine

## 2020-01-21 IMAGING — MG DIGITAL SCREENING BILATERAL MAMMOGRAM WITH TOMO AND CAD
8 series · 8 of 24 positions shown · non-contrast
Comparison: None.

CLINICAL DATA: Screening.

EXAM:
DIGITAL SCREENING BILATERAL MAMMOGRAM WITH TOMO AND CAD

[R CC synth-2D]
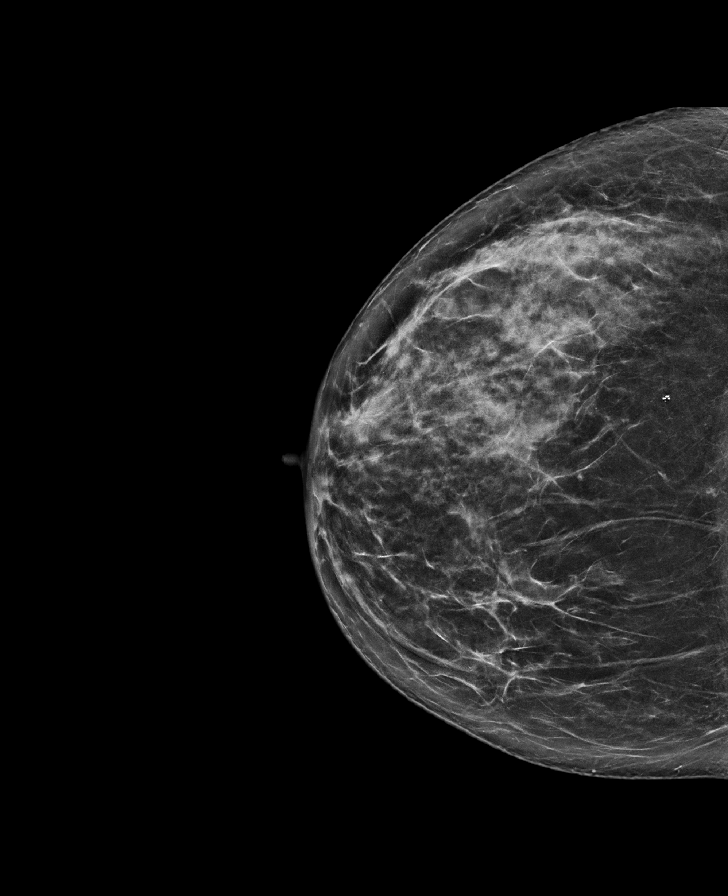

[L CC synth-2D]
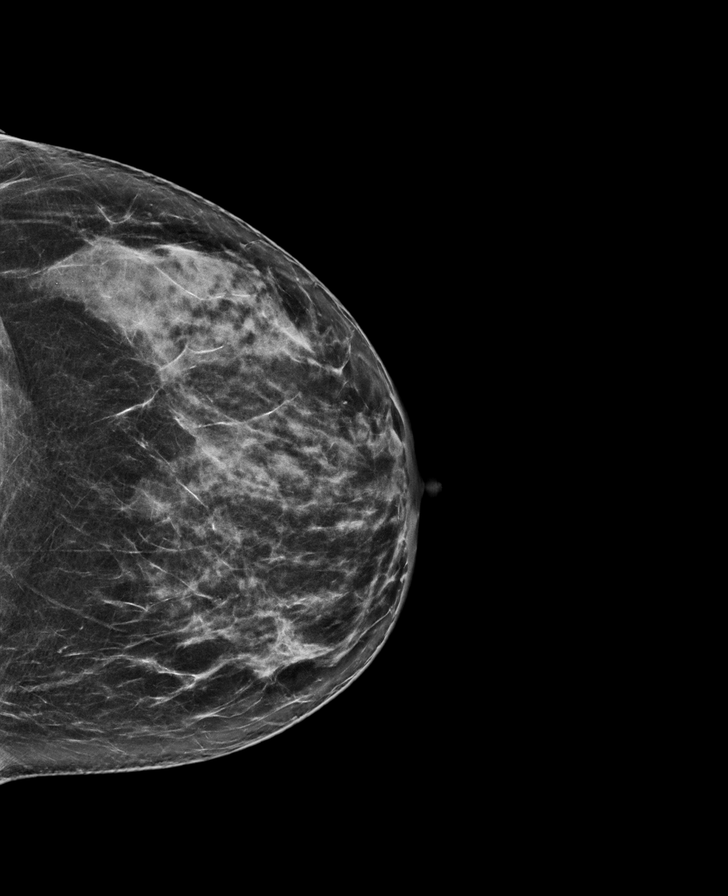

[L MLO synth-2D]
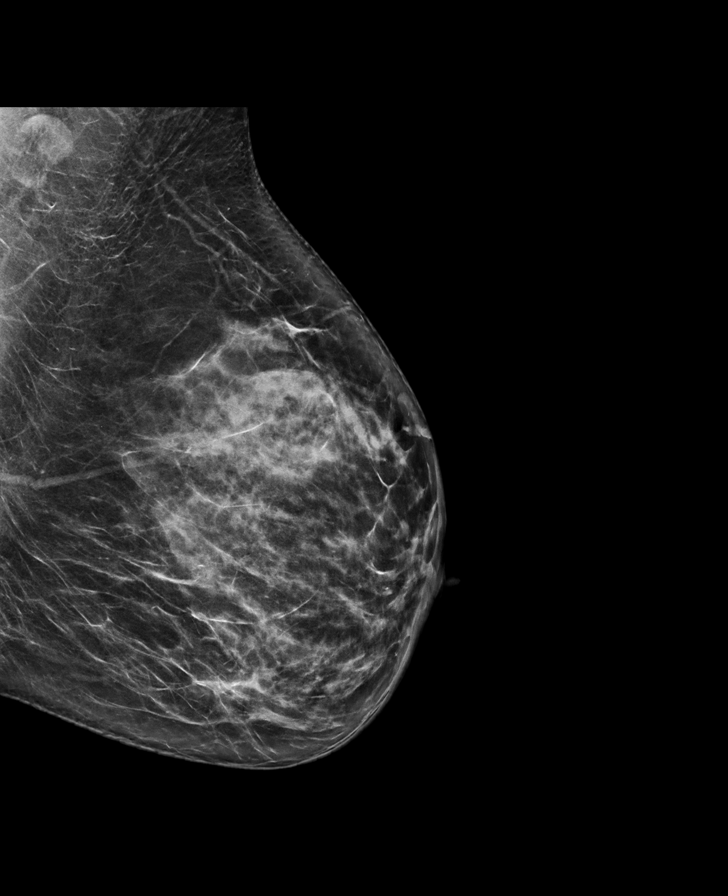

[R MLO synth-2D]
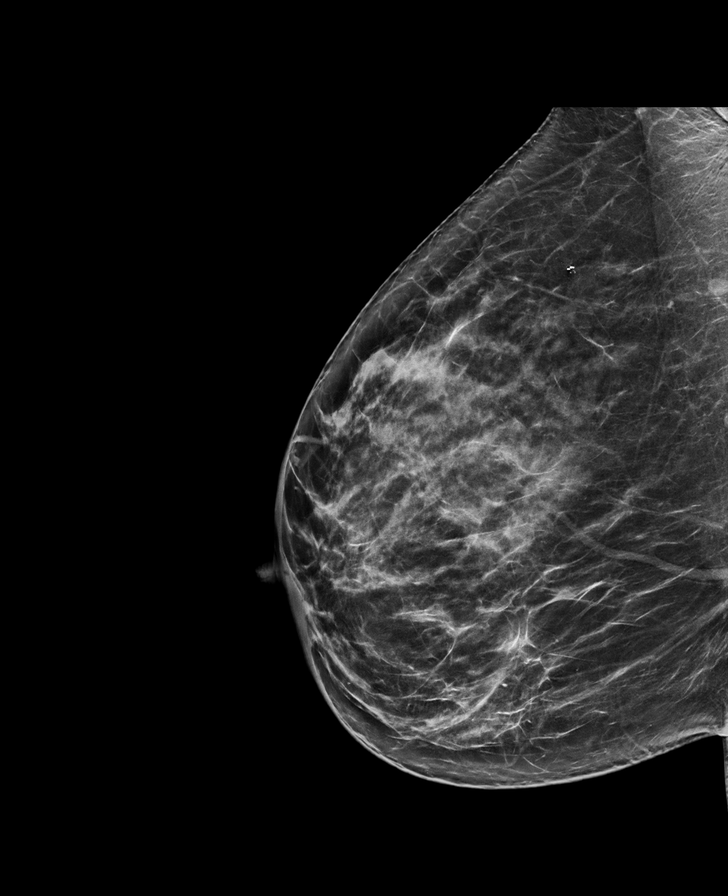

[L CC tomo · tomo slice 35/69.0]
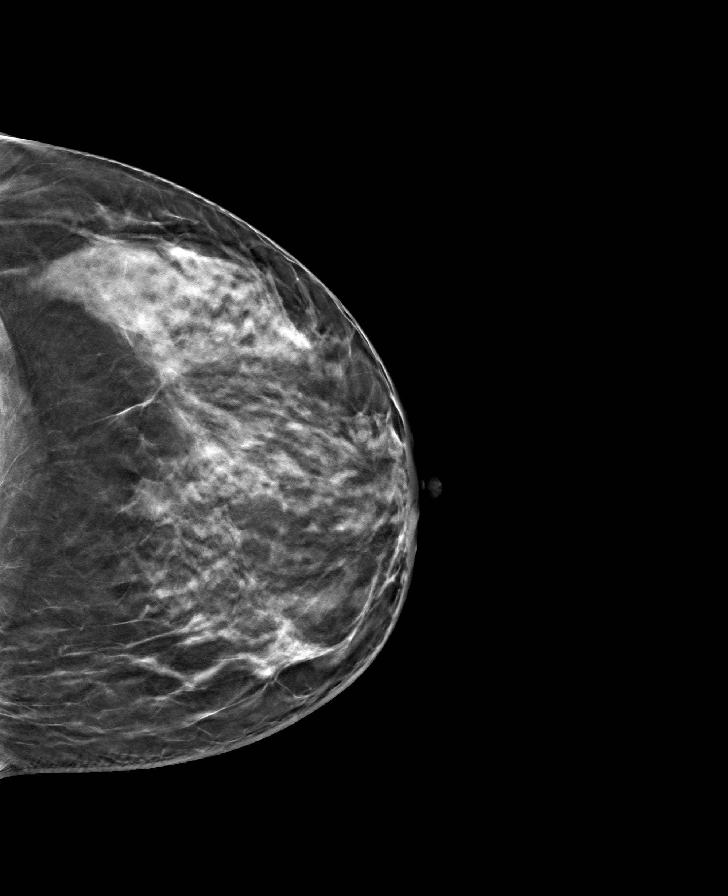

[L MLO tomo · tomo slice 39/78.0]
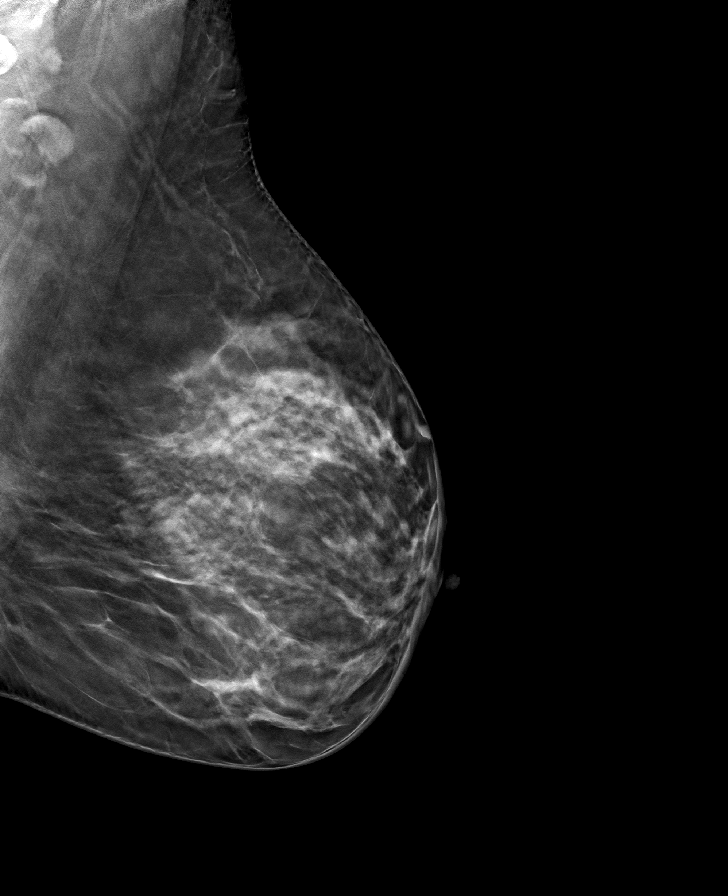

[R MLO tomo · tomo slice 38/75.0]
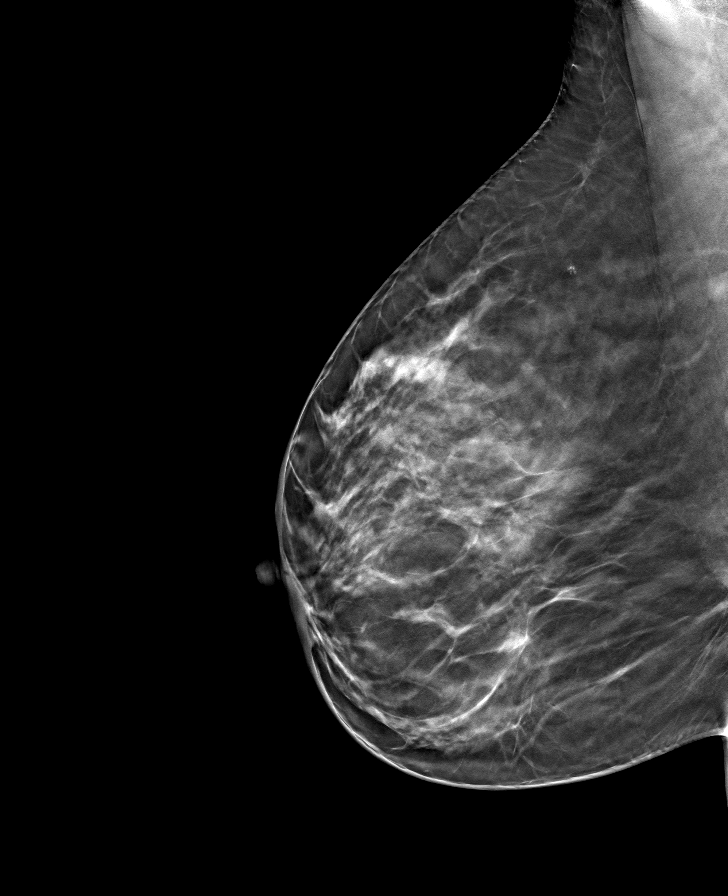

[R CC tomo · tomo slice 39/76.0]
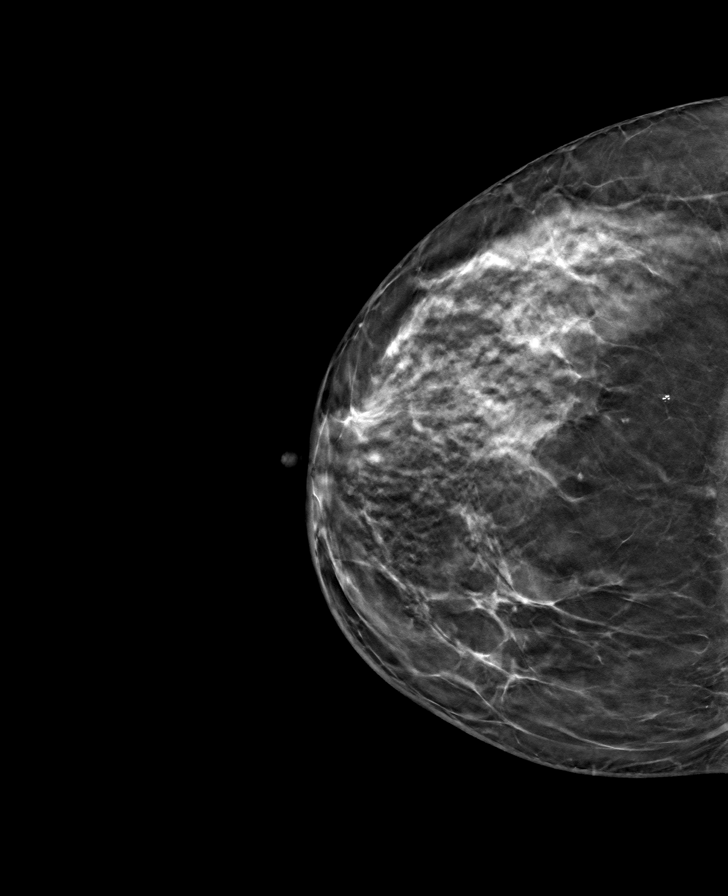

[8 of 24 positions shown; findings below may reference images not displayed]

ACR Breast Density Category c: The breast tissue is heterogeneously
dense, which may obscure small masses
FINDINGS: There are no findings suspicious for malignancy. Images were
processed with CAD.
IMPRESSION: No mammographic evidence of malignancy. A result letter of this
screening mammogram will be mailed directly to the patient.

RECOMMENDATION:
Screening mammogram at age 39. (Code:[id number]-[id number])

BI-RADS CATEGORY  1: Negative.

## 2020-02-14 ENCOUNTER — Encounter: Payer: Self-pay | Admitting: Family Medicine

## 2020-02-14 ENCOUNTER — Other Ambulatory Visit: Payer: Self-pay | Admitting: Family Medicine

## 2020-03-02 ENCOUNTER — Ambulatory Visit: Payer: No Typology Code available for payment source | Admitting: Family Medicine

## 2020-03-07 ENCOUNTER — Other Ambulatory Visit: Payer: Self-pay | Admitting: Obstetrics and Gynecology

## 2020-03-07 LAB — HM PAP SMEAR: HM Pap smear: NEGATIVE

## 2020-03-15 ENCOUNTER — Other Ambulatory Visit: Payer: Self-pay

## 2020-03-19 ENCOUNTER — Ambulatory Visit (INDEPENDENT_AMBULATORY_CARE_PROVIDER_SITE_OTHER): Payer: No Typology Code available for payment source | Admitting: Family Medicine

## 2020-03-19 ENCOUNTER — Other Ambulatory Visit: Payer: Self-pay

## 2020-03-19 ENCOUNTER — Encounter: Payer: Self-pay | Admitting: Family Medicine

## 2020-03-19 ENCOUNTER — Other Ambulatory Visit: Payer: Self-pay | Admitting: Family Medicine

## 2020-03-19 VITALS — BP 110/80 | HR 80 | Temp 98.8°F | Ht 64.0 in | Wt 148.0 lb

## 2020-03-19 DIAGNOSIS — H6982 Other specified disorders of Eustachian tube, left ear: Secondary | ICD-10-CM

## 2020-03-19 DIAGNOSIS — Z13220 Encounter for screening for lipoid disorders: Secondary | ICD-10-CM

## 2020-03-19 DIAGNOSIS — Z0000 Encounter for general adult medical examination without abnormal findings: Secondary | ICD-10-CM

## 2020-03-19 DIAGNOSIS — E663 Overweight: Secondary | ICD-10-CM

## 2020-03-19 DIAGNOSIS — E559 Vitamin D deficiency, unspecified: Secondary | ICD-10-CM

## 2020-03-19 DIAGNOSIS — H6992 Unspecified Eustachian tube disorder, left ear: Secondary | ICD-10-CM

## 2020-03-19 DIAGNOSIS — F40243 Fear of flying: Secondary | ICD-10-CM

## 2020-03-19 DIAGNOSIS — Z0001 Encounter for general adult medical examination with abnormal findings: Secondary | ICD-10-CM

## 2020-03-19 LAB — COMPREHENSIVE METABOLIC PANEL
ALT: 11 U/L (ref 0–35)
AST: 13 U/L (ref 0–37)
Albumin: 4.1 g/dL (ref 3.5–5.2)
Alkaline Phosphatase: 44 U/L (ref 39–117)
BUN: 20 mg/dL (ref 6–23)
CO2: 29 mEq/L (ref 19–32)
Calcium: 9.3 mg/dL (ref 8.4–10.5)
Chloride: 104 mEq/L (ref 96–112)
Creatinine, Ser: 0.88 mg/dL (ref 0.40–1.20)
GFR: 82.17 mL/min (ref >60.00)
Glucose, Bld: 88 mg/dL (ref 70–99)
Potassium: 3.7 mEq/L (ref 3.5–5.1)
Sodium: 140 mEq/L (ref 135–145)
Total Bilirubin: 0.9 mg/dL (ref 0.2–1.2)
Total Protein: 6.4 g/dL (ref 6.0–8.3)

## 2020-03-19 LAB — LIPID PANEL
Cholesterol: 213 mg/dL — ABNORMAL HIGH (ref 0–200)
HDL: 68.9 mg/dL (ref >39.00)
LDL Cholesterol: 106 mg/dL — ABNORMAL HIGH (ref 0–99)
NonHDL: 144.09
Total CHOL/HDL Ratio: 3
Triglycerides: 188 mg/dL — ABNORMAL HIGH (ref 0.0–149.0)
VLDL: 37.6 mg/dL (ref 0.0–40.0)

## 2020-03-19 LAB — HEMOGLOBIN A1C: Hgb A1c MFr Bld: 5.3 % (ref 4.6–6.5)

## 2020-03-19 LAB — VITAMIN D 25 HYDROXY (VIT D DEFICIENCY, FRACTURES): VITD: 37.85 ng/mL (ref 30.00–100.00)

## 2020-03-19 MED ORDER — ALPRAZOLAM 0.25 MG PO TABS: 0.2500 mg | ORAL_TABLET | Freq: Once | ORAL | 0 refills | Status: DC | PRN

## 2020-03-19 NOTE — Assessment & Plan Note (Signed)
Xanax will be refilled.  Controlled substance database reviewed.

## 2020-03-19 NOTE — Assessment & Plan Note (Signed)
Physical exam completed.  Encouraged continued healthy diet.  Discussed continuing with exercise and increasing it.  She will call to get her mammogram scheduled.  I encouraged her to get her COVID-19 booster.  I congratulated her on quitting smoking.  Lab work as outlined.

## 2020-03-19 NOTE — Patient Instructions (Signed)
Nice to see you. We will get lab work today. You can try Zyrtec for your clogged ear. Please try to get more exercise.

## 2020-03-19 NOTE — Progress Notes (Signed)
Kristie Rumps, MD Phone: [phone]  Kristie Woods is a 40 y.o. female who presents today for CPE.  Diet: Generally healthy though does eat some junk food.  No soda or sweet tea. Exercise: She is try to get back into soccer.  They are also planning on building a gym in their basement. Pap smear: 03/07/2020 NILM and negative HPV Colonoscopy: Not indicated Mammogram: Ordered by gynecology, patient to schedule Family history-  Colon cancer: No  Breast cancer: No  Ovarian cancer: No Vaccines-   Flu: Up-to-date  Tetanus: Up-to-date  Shingles: Not indicated  COVID19: Has not received her booster vaccine  Pneumonia: Not indicated HIV screening: Up-to-date Hep C Screening: Up-to-date Tobacco use: Former smoker quit several months ago Alcohol use: 2-3 beverages on the weekend Illicit Drug use: No Dentist: Yes Ophthalmology: Yes  Left ear fullness: Patient notes she had a respiratory illness previously and had several negative Covid tests.  She saw her gynecologist a little over week ago and they placed her on a Z-Pak.  She notes her symptoms overall have improved though her left ear still feels clogged.  Fear flying: Patient notes she is going on several trips this summer and typically takes Xanax when she flies.  She notes this has been beneficial in the past.  It does not make her drowsy.  She does not mix alcohol with it.   Active Ambulatory Problems    Diagnosis Date Noted  . Migraine 09/23/2012  . Encounter for general adult medical examination with abnormal findings 01/17/2014  . H/O cesarean section complicating pregnancy 68/12/5724  . Post-operative state 02/16/2015  . 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 Abnormal MRI 02/26/2018  . Eustachian tube dysfunction, left 03/19/2020   Resolved Ambulatory Problems    Diagnosis Date Noted  . Abdominal pain, lower 09/23/2012  . Flank pain, acute 12/06/2012   Past Medical History:  Diagnosis Date  . Anemia   . GERD  (gastroesophageal reflux disease)   . H/O colposcopy with cervical biopsy 2009  . Hyperlipidemia   . Kidney stones 2011    Family History  Problem Relation Age of Onset  . Hyperlipidemia Mother   . Thyroid disease Mother   . Heart disease Father   . Stroke Father 87  . Hyperlipidemia Father   . Crohn's disease Brother   . Alcohol abuse Maternal Grandmother   . Ulcerative colitis Brother     Social History   Socioeconomic History  . Marital status: Married    Spouse name: Not on file  . Number of children: Not on file  . Years of education: Not on file  . Highest education level: Not on file  Occupational History  . Not on file  Tobacco Use  . Smoking status: Current Some Day Smoker    Packs/day: 0.25    Years: 10.00    Pack years: 2.50    Last attempt to quit: 01/15/2015    Years since quitting: 5.1  . Smokeless tobacco: Never Used  Substance and Sexual Activity  . Alcohol use: Yes    Comment: Occassionally  . Drug use: No  . Sexual activity: Yes  Other Topics Concern  . Not on file  Social History Narrative   Lives in [place] with husband and Daughter, 35month, Kristie Woods     Work - [employer]- healthy      Exercise - soccer, [employer] walks  Social Determinants of Health   Financial Resource Strain: Not on file  Food Insecurity: Not on file  Transportation Needs: Not on file  Physical Activity: Not on file  Stress: Not on file  Social Connections: Not on file  Intimate Partner Violence: Not on file    ROS  General:  Negative for nexplained weight loss, fever Skin: Negative for new or changing mole, sore that won't heal HEENT: Negative for trouble hearing, trouble seeing, ringing in ears, mouth sores, hoarseness, change in voice, dysphagia. CV:  Negative for chest pain, dyspnea, edema, palpitations Resp: Negative for cough, dyspnea, hemoptysis GI: Negative for nausea, vomiting, diarrhea, constipation, abdominal pain,  melena, hematochezia. GU: Negative for dysuria, incontinence, urinary hesitance, hematuria, vaginal or penile discharge, polyuria, sexual difficulty, lumps in testicle or breasts MSK: Negative for muscle cramps or aches, joint pain or swelling Neuro: Negative for headaches, weakness, numbness, dizziness, passing out/fainting Psych: Negative for depression, anxiety, memory problems  Objective  Physical Exam Vitals:   03/19/20 1354  BP: 110/80  Pulse: 80  Temp: 98.8 F (37.1 C)  SpO2: 99%    BP Readings from Last 3 Encounters:  03/19/20 110/80  03/02/19 115/80  02/26/18 100/70   Wt Readings from Last 3 Encounters:  03/19/20 148 lb (67.1 kg)  03/02/19 141 lb 9.6 oz (64.2 kg)  02/26/18 142 lb 8 oz (64.6 kg)    Physical Exam Constitutional:      General: She is not in acute distress.    Appearance: She is not diaphoretic.  HENT:     Head: Normocephalic and atraumatic.     Ears:     Comments: Small amount of clear fluid behind both TMs, no bulging, no purulence, no erythema of the TMs Eyes:     Conjunctiva/sclera: Conjunctivae normal.     Pupils: Pupils are equal, round, and reactive to light.  Cardiovascular:     Rate and Rhythm: Normal rate and regular rhythm.     Heart sounds: Normal heart sounds.  Pulmonary:     Effort: Pulmonary effort is normal.     Breath sounds: Normal breath sounds.  Abdominal:     General: Bowel sounds are normal. There is no distension.     Palpations: Abdomen is soft.     Tenderness: There is no abdominal tenderness. There is no guarding or rebound.  Musculoskeletal:        General: No edema.     Right lower leg: No edema.     Left lower leg: No edema.  Skin:    General: Skin is warm and dry.  Neurological:     Mental Status: She is alert.  Psychiatric:        Mood and Affect: Mood normal.      Assessment/Plan:   Problem List Items Addressed This Visit    Encounter for general adult medical examination with abnormal findings -  Primary    Physical exam completed.  Encouraged continued healthy diet.  Discussed continuing with exercise and increasing it.  She will call to get her mammogram scheduled.  I encouraged her to get her COVID-19 booster.  I congratulated her on quitting smoking.  Lab work as outlined.      Eustachian tube dysfunction, left    I suspect this may improve the further she gets from her recent respiratory illness.  Advised if it is not improving over the next several days to a week she can try adding Zyrtec.      Fear of flying  Xanax will be refilled.  Controlled substance database reviewed.      Relevant Medications   ALPRAZolam (XANAX) 0.25 MG tablet    Other Visit Diagnoses    Lipid screening       Relevant Orders   Lipid panel   Comp Met (CMET)   Overweight       Relevant Orders   HgB A1c   Vitamin D deficiency       Relevant Orders   Vitamin D (25 hydroxy)      This visit occurred during the SARS-CoV-2 public health emergency.  Safety protocols were in place, including screening questions prior to the visit, additional usage of staff PPE, and extensive cleaning of exam room while observing appropriate contact time as indicated for disinfecting solutions.    Kristie Rumps, MD [place]

## 2020-03-19 NOTE — Assessment & Plan Note (Signed)
I suspect this may improve the further she gets from her recent respiratory illness.  Advised if it is not improving over the next several days to a week she can try adding Zyrtec.

## 2020-03-29 ENCOUNTER — Other Ambulatory Visit: Payer: Self-pay | Admitting: Obstetrics and Gynecology

## 2020-03-29 ENCOUNTER — Other Ambulatory Visit: Payer: Self-pay

## 2020-03-29 ENCOUNTER — Emergency Department
Admission: EM | Admit: 2020-03-29 | Discharge: 2020-03-29 | Disposition: A | Discharge status: Home / Self Care | Payer: No Typology Code available for payment source | Attending: Emergency Medicine | Admitting: Emergency Medicine

## 2020-03-29 DIAGNOSIS — T368X5A Adverse effect of other systemic antibiotics, initial encounter: Secondary | ICD-10-CM | POA: Diagnosis not present

## 2020-03-29 DIAGNOSIS — R1110 Vomiting, unspecified: Secondary | ICD-10-CM | POA: Diagnosis not present

## 2020-03-29 DIAGNOSIS — X58XXXA Exposure to other specified factors, initial encounter: Secondary | ICD-10-CM | POA: Diagnosis not present

## 2020-03-29 DIAGNOSIS — T7840XA Allergy, unspecified, initial encounter: Secondary | ICD-10-CM

## 2020-03-29 DIAGNOSIS — Z87891 Personal history of nicotine dependence: Secondary | ICD-10-CM | POA: Insufficient documentation

## 2020-03-29 DIAGNOSIS — R197 Diarrhea, unspecified: Secondary | ICD-10-CM | POA: Insufficient documentation

## 2020-03-29 DIAGNOSIS — L299 Pruritus, unspecified: Secondary | ICD-10-CM | POA: Insufficient documentation

## 2020-03-29 MED ORDER — PREDNISONE 20 MG PO TABS
60.0000 mg | ORAL_TABLET | Freq: Once | ORAL | Status: AC
  Administered 2020-03-29: 60 mg via ORAL
  Filled 2020-03-29: qty 3

## 2020-03-29 NOTE — ED Triage Notes (Signed)
Pt states that she was given a prescription for macrobid for a UTI that she took at 1230- pt states that about 10 minutes ago she started breaking out in hives, vomited, and the pain in her LLQ is getting worse- pt states she took 50mg  benadryl and took a pepcid about 5-10 minutes ago- pt states that she has taken macrobid before and has not had a problem

## 2020-03-29 NOTE — ED Provider Notes (Signed)
[facility] Emergency Department Provider Note ____________________________________________   Event Date/Time   First MD Initiated Contact with Patient 03/29/20 1522     (approximate)  I have reviewed the triage vital signs and the nursing notes.   HISTORY  Chief Complaint Allergic Reaction    HPI [name] is a 40 y.o. female with PMH as noted below who presents with vomiting and diarrhea, acute onset approximately 30 minutes after she took a dose of Macrobid.  She saw her PMD earlier today after having dysuria for a few days.  Urinalysis showed findings consistent with UTI.  She took the first dose of Macrobid and then the symptoms started.  She has taken Macrobid in the past without any reaction, and has no history of similar allergic reactions to other medications.  She states that after the vomiting and diarrhea started, she started to feel an itching or burning in her skin.  She then developed some left lower quadrant abdominal crampy pain which radiated to her left leg.  This is almost completely resolved now.  She denies any rash or hives.  She had no tightness in her throat or shortness of breath.  She took 50 mg of Benadryl and a Pepcid prior to coming in.  Past Medical History:  Diagnosis Date  . Anemia   . GERD (gastroesophageal reflux disease)   . H/O colposcopy with cervical biopsy 2009   [facility]  . Hyperlipidemia   . Kidney stones 2011  . Migraine    occasional, no regular meds, uses OTC Excedrine Migraine    Patient Active Problem List   Diagnosis Date Noted  . Eustachian tube dysfunction, left 03/19/2020  . Abnormal MRI 02/26/2018  . 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 H/O cesarean section complicating pregnancy 27/51/7001  . Post-operative state 02/16/2015  . Encounter for general adult medical examination with abnormal findings 01/17/2014  . Migraine 09/23/2012    Past Surgical History:  Procedure Laterality Date  .  APPENDECTOMY    . CESAREAN SECTION  2013   NRFHT  . CESAREAN SECTION N/A 02/16/2015   Procedure: CESAREAN SECTION;  Surgeon: Boykin Nearing, MD;  Location: [facility] ORS;  Service: Obstetrics;  Laterality: N/A;  . NISSEN FUNDOPLICATION  7494    Prior to Admission medications   Medication Sig Start Date End Date Taking? Authorizing Provider  ALPRAZolam (XANAX) 0.25 MG tablet Take 1 tablet (0.25 mg total) by mouth once as needed for up to 1 dose for anxiety (related to flying). 03/19/20   Leone Haven, MD    Allergies Patient has no known allergies.  Family History  Problem Relation Age of Onset  . Hyperlipidemia Mother   . Thyroid disease Mother   . Heart disease Father   . Stroke Father 80  . Hyperlipidemia Father   . Crohn's disease Brother   . Alcohol abuse Maternal Grandmother   . Ulcerative colitis Brother     Social History Social History   Tobacco Use  . Smoking status: Former Smoker    Packs/day: 0.25    Years: 10.00    Pack years: 2.50    Quit date: 01/15/2015    Years since quitting: 5.2  . Smokeless tobacco: Never Used  Substance Use Topics  . Alcohol use: Yes    Comment: Occassionally  . Drug use: No    Review of Systems  Constitutional: No fever/chills. Eyes: No visual changes. ENT: No sore throat. Cardiovascular: Denies chest pain. Respiratory: Denies shortness  of breath. Gastrointestinal: Positive for resolved vomiting and diarrhea Genitourinary: Negative for dysuria.  Musculoskeletal: Negative for back pain. Skin: Negative for rash.  Positive for pruritus. Neurological: Negative for headache.   ____________________________________________   PHYSICAL EXAM:  VITAL SIGNS: ED Triage Vitals  Enc Vitals Group     BP 03/29/20 1645 114/66     Pulse Rate 03/29/20 1456 100     Resp 03/29/20 1456 18     Temp 03/29/20 1458 98.2 F (36.8 C)     Temp Source 03/29/20 1458 Oral     SpO2 03/29/20 1456 100 %     Weight 03/29/20 1456 145 lb  (65.8 kg)     Height 03/29/20 1456 5\' 4"  (1.626 m)     Head Circumference --      Peak Flow --      Pain Score 03/29/20 1456 9     Pain Loc --      Pain Edu? --      Excl. in [place]? --     Constitutional: Alert and oriented. Well appearing and in no acute distress. Eyes: Conjunctivae are normal.  Head: Atraumatic. Nose: No congestion/rhinnorhea. Mouth/Throat: Mucous membranes are moist.  Oropharynx clear. Neck: Normal range of motion.  Cardiovascular: Normal rate, regular rhythm. Grossly normal heart sounds.  Good peripheral circulation. Respiratory: Normal respiratory effort.  No retractions. Lungs CTAB. Gastrointestinal: Soft and nontender. No distention.  Genitourinary: No flank tenderness. Musculoskeletal: Extremities warm and well perfused.  Neurologic:  Normal speech and language. No gross focal neurologic deficits are appreciated.  Skin:  Skin is warm and dry. No rash noted. Psychiatric: Mood and affect are normal. Speech and behavior are normal.  ____________________________________________   LABS (all labs ordered are listed, but only abnormal results are displayed)  Labs Reviewed - No data to display ____________________________________________  EKG   ____________________________________________  RADIOLOGY    ____________________________________________   PROCEDURES  Procedure(s) performed: No  Procedures  Critical Care performed: No ____________________________________________   INITIAL IMPRESSION / ASSESSMENT AND PLAN / ED COURSE  Pertinent labs & imaging results that were available during my care of the patient were reviewed by me and considered in my medical decision making (see chart for details).  40 year old female presents with an episode of vomiting, diarrhea, itching, and some abdominal cramping after taking a dose of Macrobid.  She took Benadryl and Pepcid prior to arrival and the symptoms have now mostly resolved.  She had no tightness or  swelling in her throat or any shortness of breath.  On exam the patient is well-appearing.  Her vital signs are normal.  Physical exam is unremarkable.  Oropharynx is clear.  The abdomen is soft and nontender.  Overall presentation is most consistent with an allergic reaction, although it is also possible that the patient just had a GI reaction to the medication.  I will give a dose of prednisone we will observe the patient for an additional 1 to 2 hours.  There is no indication for epinephrine.  There is no evidence of acute intra-abdominal process.  ----------------------------------------- 5:43 PM on 03/29/2020 -----------------------------------------  The patient continues to appear comfortable on reassessment.  She has not had any recurrence of her symptoms.  She has now been in the ED for most 3 hours.  At this time, the patient is stable for discharge home.  I counseled her on the results of the work-up and the plan of care.  She has contacted her PMD and already has a new prescription  for an alternative antibiotic.  Return precautions given, and the patient expresses understanding.  ____________________________________________   FINAL CLINICAL IMPRESSION(S) / ED DIAGNOSES  Final diagnoses:  Allergic reaction, initial encounter      NEW MEDICATIONS STARTED DURING THIS VISIT:  New Prescriptions   No medications on file     Note:  This document was prepared using Dragon voice recognition software and may include unintentional dictation errors.    Arta Silence, MD 03/29/20 1744

## 2020-03-29 NOTE — Discharge Instructions (Addendum)
Take the new antibiotic as prescribed by your physician.  Return to the ER for new, worsening, or recurrent severe hives, vomiting, abdominal pain, shortness of breath, or any other new or worsening symptoms that concern you

## 2020-06-26 ENCOUNTER — Other Ambulatory Visit: Payer: Self-pay | Admitting: Obstetrics and Gynecology

## 2020-06-26 DIAGNOSIS — Z1231 Encounter for screening mammogram for malignant neoplasm of breast: Secondary | ICD-10-CM

## 2020-07-03 ENCOUNTER — Ambulatory Visit
Admission: RE | Admit: 2020-07-03 | Discharge: 2020-07-03 | Disposition: A | Discharge status: Home / Self Care | Payer: No Typology Code available for payment source | Source: Ambulatory Visit | Attending: Obstetrics and Gynecology | Admitting: Obstetrics and Gynecology

## 2020-07-03 ENCOUNTER — Other Ambulatory Visit: Payer: Self-pay

## 2020-07-03 DIAGNOSIS — Z1231 Encounter for screening mammogram for malignant neoplasm of breast: Secondary | ICD-10-CM | POA: Diagnosis not present

## 2020-07-10 ENCOUNTER — Other Ambulatory Visit: Payer: Self-pay

## 2020-07-10 ENCOUNTER — Encounter: Payer: Self-pay | Admitting: Plastic Surgery

## 2020-07-10 ENCOUNTER — Ambulatory Visit (INDEPENDENT_AMBULATORY_CARE_PROVIDER_SITE_OTHER): Payer: No Typology Code available for payment source | Admitting: Plastic Surgery

## 2020-07-10 DIAGNOSIS — N62 Hypertrophy of breast: Secondary | ICD-10-CM | POA: Diagnosis not present

## 2020-07-10 DIAGNOSIS — M542 Cervicalgia: Secondary | ICD-10-CM

## 2020-07-10 DIAGNOSIS — M546 Pain in thoracic spine: Secondary | ICD-10-CM

## 2020-07-10 DIAGNOSIS — G8929 Other chronic pain: Secondary | ICD-10-CM | POA: Diagnosis not present

## 2020-07-10 DIAGNOSIS — M549 Dorsalgia, unspecified: Secondary | ICD-10-CM | POA: Insufficient documentation

## 2020-07-10 NOTE — Progress Notes (Signed)
Patient ID: Kristie Woods, female    DOB: 1980-11-23, 40 y.o.   MRN: [id number]   Chief Complaint  Patient presents with  . Advice Only  . Breast Problem    Mammary Hyperplasia: The patient is a 40 y.o. female with a history of mammary hyperplasia for several years.  She has extremely large breasts causing symptoms that include the following: Back pain in the upper and lower back, including neck pain. She pulls or pins her bra straps to provide better lift and relief of the pressure and pain. She notices relief by holding her breast up manually.  Her shoulder straps cause grooves and pain and pressure that requires padding for relief. Pain medication is sometimes required with motrin and tylenol.  Activities that are hindered by enlarged breasts include: exercise and running.  She has tried supportive clothing as well as fitted bras without improvement.  Her breasts are extremely large and fairly symmetric.  She has hyperpigmentation of the inframammary area on both sides.  The sternal to nipple distance on the right is 29 cm and the left is 29 cm.  The IMF distance is 16 cm.  She is 5 feet 4 inches tall and weighs 145 pounds.  The BMI = 24.9.  Preoperative bra size = DDD cup.  She would like to be a C cup.   The estimated excess breast tissue to be removed at the time of surgery = 400 grams on the left and 400 grams on the right.  Mammogram history: 06/2020 and negative.  Family history of breast cancer:  nonne.  Tobacco use:  none.   The patient expresses the desire to pursue surgical intervention.   Review of Systems  Constitutional: Negative.   HENT: Negative.   Eyes: Negative.   Respiratory: Negative.  Negative for chest tightness and shortness of breath.   Cardiovascular: Negative for leg swelling.  Gastrointestinal: Negative for abdominal distention and abdominal pain.  Endocrine: Negative.   Genitourinary: Negative.   Musculoskeletal: Positive for back pain and neck pain.   Skin: Negative.   Hematological: Negative.   Psychiatric/Behavioral: Negative.     Past Medical History:  Diagnosis Date  . Anemia   . GERD (gastroesophageal reflux disease)   . H/O colposcopy with cervical biopsy 2009   [facility]  . Hyperlipidemia   . Kidney stones 2011  . Migraine    occasional, no regular meds, uses OTC Excedrine Migraine    Past Surgical History:  Procedure Laterality Date  . APPENDECTOMY    . CESAREAN SECTION  2013   NRFHT  . CESAREAN SECTION N/A 02/16/2015   Procedure: CESAREAN SECTION;  Surgeon: Boykin Nearing, MD;  Location: [facility] ORS;  Service: Obstetrics;  Laterality: N/A;  . NISSEN FUNDOPLICATION  0932      Current Outpatient Medications:  .  ALPRAZolam (XANAX) 0.25 MG tablet, TAKE 1 TABLET BY MOUTH ONCE AS NEEDED FOR UP TO 1 DOSE FOR ANXIETY (RELATED TO FLYING)., Disp: 10 tablet, Rfl: 0 .  amoxicillin-clavulanate (AUGMENTIN) 875-125 MG tablet, TAKE 1 TABLET BY MOUTH TWO TIMES DAILY FOR 10 DAYS (Patient taking differently: Take 1 tablet by mouth 2 (two) times daily. for 10 days), Disp: 20 tablet, Rfl: 0 .  azithromycin (ZITHROMAX) 250 MG tablet, TAKE 2 TABLETS BY MOUTH ON DAY 1. THEN TAKE 1 TABLET BY MOUTH ON DAYS 2-5. (Patient taking differently: TAKE 2 TABLETS BY MOUTH ON DAY 1. THEN TAKE 1 TABLET BY MOUTH ON DAYS 2-5.), Disp:  6 tablet, Rfl: 0 .  nitrofurantoin, macrocrystal-monohydrate, (MACROBID) 100 MG capsule, TAKE 1 CAPSULE BY MOUTH TWO TIMES DAILY FOR 7 DAYS (Patient taking differently: Take by mouth 2 (two) times daily. for 7 days), Disp: 14 capsule, Rfl: 0 .  nitrofurantoin, macrocrystal-monohydrate, (MACROBID) 100 MG capsule, TAKE 1 CAPSULE BY MOUTH TWICE A DAY, Disp: 14 capsule, Rfl: 0 .  sulfamethoxazole-trimethoprim (BACTRIM DS) 800-160 MG tablet, TAKE 1 TABLET BY MOUTH TWICE DAILY FOR 3 DAYS (Patient taking differently: Take 1 tablet by mouth 2 (two) times daily. for 3 days), Disp: 6 tablet, Rfl: 0   Objective:   Vitals:    07/10/20 0830  BP: 110/75  Pulse: 70  SpO2: 100%    Physical Exam Vitals and nursing note reviewed.  Constitutional:      Appearance: Normal appearance.  HENT:     Head: Normocephalic and atraumatic.  Eyes:     Pupils: Pupils are equal, round, and reactive to light.  Cardiovascular:     Rate and Rhythm: Normal rate.     Pulses: Normal pulses.  Pulmonary:     Effort: Pulmonary effort is normal. No respiratory distress.  Abdominal:     General: Abdomen is flat. There is no distension.  Musculoskeletal:        General: No deformity.  Skin:    General: Skin is warm.     Capillary Refill: Capillary refill takes less than 2 seconds.     Coloration: Skin is not jaundiced.     Findings: No bruising.  Neurological:     General: No focal deficit present.     Mental Status: She is alert and oriented to person, place, and time.  Psychiatric:        Mood and Affect: Mood normal.        Thought Content: Thought content normal.     Assessment & Plan:  Chronic bilateral thoracic back pain  Symptomatic mammary hypertrophy  Neck pain  The procedure the patient selected and that was best for the patient was discussed. The risk were discussed and include but not limited to the following:  Breast asymmetry, fluid accumulation, firmness of the breast, inability to breast feed, loss of nipple or areola, skin loss, change in skin and nipple sensation, fat necrosis of the breast tissue, bleeding, infection and healing delay.  There are risks of anesthesia and injury to nerves or blood vessels.  Allergic reaction to tape, suture and skin glue are possible.  There will be swelling.  Any of these can lead to the need for revisional surgery.  A breast reduction has potential to interfere with diagnostic procedures in the future.  This procedure is best done when the breast is fully developed.  Changes in the breast will continue to occur over time: pregnancy, weight gain or weigh loss.    Total  time: 45 minutes. This includes time spent with the patient during the visit as well as time spent before and after the visit reviewing the chart, documenting the encounter and ordering pertinent studies. and literature emailed to the patient.   Physical therapy:  ordered Mammogram:  done  Pictures were obtained of the patient and placed in the chart with the patient's or guardian's permission.     [place], DO

## 2020-07-20 ENCOUNTER — Other Ambulatory Visit: Payer: Self-pay

## 2020-08-02 ENCOUNTER — Other Ambulatory Visit: Payer: Self-pay

## 2020-08-02 MED ORDER — AMOXICILLIN-POT CLAVULANATE 500-125 MG PO TABS
1.0000 | ORAL_TABLET | Freq: Two times a day (BID) | ORAL | 0 refills | Status: DC
  Filled 2020-08-02: qty 14, 7d supply, fill #0

## 2020-08-20 ENCOUNTER — Ambulatory Visit: Payer: No Typology Code available for payment source | Attending: Plastic Surgery | Admitting: Physical Therapy

## 2020-08-20 ENCOUNTER — Encounter: Payer: Self-pay | Admitting: Physical Therapy

## 2020-08-20 ENCOUNTER — Other Ambulatory Visit: Payer: Self-pay

## 2020-08-20 DIAGNOSIS — M546 Pain in thoracic spine: Secondary | ICD-10-CM | POA: Insufficient documentation

## 2020-08-20 DIAGNOSIS — R293 Abnormal posture: Secondary | ICD-10-CM | POA: Insufficient documentation

## 2020-08-20 NOTE — Therapy (Signed)
[place] MAIN [facility] SERVICES [address] [place], [place], [address] Phone: [phone]   Fax:  [phone]  Physical Therapy Evaluation  Patient Details  Name: Kristie Woods MRN: [id number] Date of Birth: 1980-11-05 Referring Provider (PT): Audelia Hives DO   Encounter Date: 08/20/2020   PT End of Session - 08/20/20 1326     Visit Number 1    Number of Visits 5    Date for PT Re-Evaluation 09/17/20    Authorization Type Focus plan    PT Start Time 1016    PT Stop Time 1110    PT Time Calculation (min) 54 min    Activity Tolerance Patient tolerated treatment well    Behavior During Therapy [facility] for tasks assessed/performed             Past Medical History:  Diagnosis Date   Anemia    GERD (gastroesophageal reflux disease)    H/O colposcopy with cervical biopsy 2009   [facility]   Hyperlipidemia    Kidney stones 2011   Migraine    occasional, no regular meds, uses OTC Excedrine Migraine    Past Surgical History:  Procedure Laterality Date   APPENDECTOMY     CESAREAN SECTION  2013   NRFHT   CESAREAN SECTION N/A 02/16/2015   Procedure: CESAREAN SECTION;  Surgeon: Boykin Nearing, MD;  Location: [facility] ORS;  Service: Obstetrics;  Laterality: N/A;   NISSEN FUNDOPLICATION  [id number]    There were no vitals filed for this visit.    Subjective Assessment - 08/20/20 1022     Subjective "I am here to make sure that I don't need back surgery but that breast reduction surgery will help alleviate my back pain"    Pertinent History 40 yo Female presents to therapy for chronic back/neck pain for years. Patient is supposed to have breast reduction surgery but needs skilled PT intervention to rule out any other causes for back/neck pain. She denies any numbness/tingling; Patient reports pain is worse with lifting and housework with flexed posture. She is able to find relief when stretching back into extension/lying supine;     Limitations Lifting    How long can you sit comfortably? 30 min;    How long can you stand comfortably? worse after 30 min    How long can you walk comfortably? doesn't make it better; will be flexed/hunched over when walking;    Diagnostic tests none recent    Patient Stated Goals "to qualify for surgery for breast reduction"    Currently in Pain? Yes    Pain Score 3     Pain Location Back    Pain Orientation Mid;Upper    Pain Descriptors / Indicators Aching    Pain Type Chronic pain    Pain Onset More than a month ago    Pain Frequency Intermittent    Aggravating Factors  worse with laundry/lifting/cleaning;    Pain Relieving Factors laying on back with arms outstretched to stretch back;    Effect of Pain on Daily Activities decreased activity tolerance;    Multiple Pain Sites No                [facility] PT Assessment - 08/20/20 0001       Assessment   Medical Diagnosis Back/neck Pain    Referring Provider (PT) Dillingham, Lyndee Leo DO    Onset Date/Surgical Date --   several years   Hand Dominance Right    Next MD  Visit None scheduled, will follow up after PT    Prior Therapy never had PT for this condition or any condition      Precautions   Precautions None    Required Braces or Orthoses --   none     Restrictions   Weight Bearing Restrictions No      Balance Screen   Has the patient fallen in the past 6 months No    Has the patient had a decrease in activity level because of a fear of falling?  No    Is the patient reluctant to leave their home because of a fear of falling?  No      Prior Function   Level of Independence Independent    Vocation Requirements working full-time; helps transport patient, does some desk work    Leisure has 2 children (9 and 5), play soccer, travel,      Cognition   Overall Cognitive Status Within Functional Limits for tasks assessed      Observation/Other Assessments   Observations Sits with mild flexed posture, able to self  correct with minimal difficulty    Other Surveys  Modified Oswestry    Modified Oswestry 8%      Sensation   Light Touch Appears Intact    Proprioception Appears Intact      Coordination   Gross Motor Movements are Fluid and Coordinated Yes    Fine Motor Movements are Fluid and Coordinated Yes      Posture/Postural Control   Posture Comments sits with mild flexed posture, increased thoracic kyphosis, able to self correct with cues with increased discomfort; equal shoulder and hip height, equal scapular positioning      ROM / Strength   AROM / PROM / Strength AROM;Strength      AROM   Overall AROM Comments BUE are [facility]      Strength   Overall Strength Comments BUE are WNL; back strength grossly 4+/5, no increased pain reported during MMT      Palpation   Spinal mobility normal mobility throughout thoracic spine with central PA mobs; no pain reported;    Palpation comment reports mild tenderness along scapular paraspinals and along bilateral upper trapezius. Moderate tightness noted in thoracic/scapular paraspinals.      Transfers   Comments able to transfer well, no difficulty      Ambulation/Gait   Gait Comments Pt has normal gait mechanics, no abnormality      Balance   Balance Assessed --   pt demonstrates normal standing balance                       Objective measurements completed on examination: See above findings.    TREATMENT: Patient instructed in supine positioning over 1/2 bolster (along thoracic spine) with arms in "T" position, no stretch reported. Good pectoralis ROM with no tightness in anterior chest Pt reports mild discomfort with pressure along scapular medial border  Instructed patient in seated thoracic extension over 1/2 bolster with good stretch reported, no discomfort x5 reps Instructed patient in supine position with 1/2 bolster perpendicular to thoracic spine "T" position with thoracic extension over bolster x5 reps with good  tolerance;  Patient reports minimal discomfort with stretches. She exhibits good ROM and no change in symptoms following exercise;            PT Education - 08/20/20 1326     Education Details positioning/stretches    Person(s) Educated Patient  Methods Explanation    Comprehension Verbalized understanding              PT Short Term Goals - 08/20/20 1338       PT SHORT TERM GOAL #1   Title Patient will be adherent to HEP at least 3 x a week to improve postural re-education and help reduce symptoms.    Baseline 7/11: doesn't have HEP    Time 1    Period Weeks    Status New    Target Date 08/27/20               PT Long Term Goals - 08/20/20 1339       PT LONG TERM GOAL #1   Title Patient will report a maximum of 3/10 back pain over last week to indicate improved symptoms with ADLs.    Time 4    Period Weeks    Status New    Target Date 09/17/20      PT LONG TERM GOAL #2   Title Patient will tolerate 1 hour of sitting or standing without increase in symptoms to increase tolerance with ADLs.    Time 4    Period Weeks    Status New    Target Date 09/17/20                    Plan - 08/20/20 1327     Clinical Impression Statement 40 yo Female reports chronic back/neck pain for many years. She believes her pain is related to increased breast size which weighs her down leading to increased thoracic kyphosis. PT performed skilled PT evaluation. Patient exhibits mild thoracic kyphosis especially with unsupported sitting but is able to self correct with cues. She exhibits normal ROM/strength in back paraspinals. Patient does exhibit moderate tightness in scapular/thoracic paraspinals which she reports worsen with repeated work. Given no specific structural abnormality with normal ROM/strength and good postural control, PT agrees that increased breast size is likely contributing to persistent pain. Patient reports lower pain levels today and scored low  on modified oswestry survey. However her pain does affect mobility with decreased work tolerance and increased pain with household chores. Patient would benefit from skilled PT intervention trial to further assess body mechanics and reduce strain on soft tissue to see if pain can be alleviated conservatively.    Personal Factors and Comorbidities Time since onset of injury/illness/exacerbation    Examination-Activity Limitations Carry;Lift    Examination-Participation Restrictions Cleaning;Occupation;Laundry    Stability/Clinical Decision Making Stable/Uncomplicated    Clinical Decision Making Low    Rehab Potential Fair    PT Frequency 1x / week    PT Duration 4 weeks    PT Treatment/Interventions Cryotherapy;Electrical Stimulation;Moist Heat;Therapeutic exercise;Patient/family education;Manual techniques;Passive range of motion;Dry needling;Energy conservation;Taping    PT Next Visit Plan review body mechanics with laundry, advance HEP with postural strengthening, consider taping;    PT Home Exercise Plan will address next session;    Consulted and Agree with Plan of Care Patient             Patient will benefit from skilled therapeutic intervention in order to improve the following deficits and impairments:  Decreased activity tolerance, Increased fascial restricitons, Postural dysfunction, Pain, Increased muscle spasms  Visit Diagnosis: Pain in thoracic spine  Abnormal posture     Problem List Patient Active Problem List   Diagnosis Date Noted   Back pain 07/10/2020   Symptomatic mammary hypertrophy 07/10/2020   Neck pain 07/10/2020  Eustachian tube dysfunction, left 03/19/2020   Abnormal MRI 02/26/2018   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   H/O cesarean section complicating pregnancy 50/10/3816   Post-operative state 02/16/2015   Encounter for general adult medical examination with abnormal findings 01/17/2014   Migraine 09/23/2012    Scotland Korver PT, DPT 08/20/2020,  2:38 PM  [place] MAIN [facility] SERVICES [place], [place], [address] Phone: [phone]   Fax:  [phone]  Name: Kristie Woods MRN: [id number] Date of Birth: 10-01-1980

## 2020-08-22 ENCOUNTER — Ambulatory Visit: Payer: No Typology Code available for payment source | Admitting: Physical Therapy

## 2020-08-27 ENCOUNTER — Other Ambulatory Visit: Payer: Self-pay

## 2020-08-27 ENCOUNTER — Encounter: Payer: Self-pay | Admitting: Physical Therapy

## 2020-08-27 ENCOUNTER — Ambulatory Visit: Payer: No Typology Code available for payment source | Admitting: Physical Therapy

## 2020-08-27 DIAGNOSIS — M546 Pain in thoracic spine: Secondary | ICD-10-CM

## 2020-08-27 DIAGNOSIS — R293 Abnormal posture: Secondary | ICD-10-CM

## 2020-08-27 NOTE — Therapy (Signed)
[place] MAIN [facility] SERVICES [address] [place], [place], [address] Phone: [phone]   Fax:  [phone]  Physical Therapy Treatment  Patient Details  Name: Kristie Woods MRN: [id number] Date of Birth: Feb 14, 1980 Referring Provider (PT): Audelia Hives DO   Encounter Date: 08/27/2020   PT End of Session - 08/27/20 1023     Visit Number 2    Number of Visits 5    Date for PT Re-Evaluation 09/17/20    Authorization Type Focus plan    PT Start Time 1016    PT Stop Time 1050    PT Time Calculation (min) 34 min    Activity Tolerance Patient tolerated treatment well    Behavior During Therapy [facility] for tasks assessed/performed             Past Medical History:  Diagnosis Date   Anemia    GERD (gastroesophageal reflux disease)    H/O colposcopy with cervical biopsy 2009   [facility]   Hyperlipidemia    Kidney stones 2011   Migraine    occasional, no regular meds, uses OTC Excedrine Migraine    Past Surgical History:  Procedure Laterality Date   APPENDECTOMY     CESAREAN SECTION  2013   NRFHT   CESAREAN SECTION N/A 02/16/2015   Procedure: CESAREAN SECTION;  Surgeon: Boykin Nearing, MD;  Location: [facility] ORS;  Service: Obstetrics;  Laterality: N/A;   NISSEN FUNDOPLICATION  [id number]    There were no vitals filed for this visit.   Subjective Assessment - 08/27/20 1022     Subjective Patient reports pain is about the same. No changes. She is still having pain with ADLS and prolonged sitting/activities. Worst pain has been 3/10 in last 48 hours.    Pertinent History 40 yo Female presents to therapy for chronic back/neck pain for years. Patient is supposed to have breast reduction surgery but needs skilled PT intervention to rule out any other causes for back/neck pain. She denies any numbness/tingling; Patient reports pain is worse with lifting and housework with flexed posture. She is able to find relief when stretching  back into extension/lying supine;    Limitations Lifting    How long can you sit comfortably? 30 min;    How long can you stand comfortably? worse after 30 min    How long can you walk comfortably? doesn't make it better; will be flexed/hunched over when walking;    Diagnostic tests none recent    Patient Stated Goals "to qualify for surgery for breast reduction"    Currently in Pain? No/denies    Pain Onset More than a month ago    Multiple Pain Sites No                TREATMENT: Patient educated in proper body mechanics with laundry including to avoid excessive stooping/thoracic flexion. Also educated patient to spread out folding to allow for breaks and repositioning;  Patient verbalized understanding  Patient supine over 1/2 bolster (perpendicular to thoracic spine)  3# handweight:  -alternate UE lift/flexion x10 reps bilaterally;  -BUE horizontal abduction x10 reps  Qped with 3# handweight: -low row x10 reps each UE -alternate UE lift x10 reps each UE -Horizontal abduction mid row x10 reps each UE Patient required min VCs for proper positioning and exercise technique including to increase core stabilization for better trunk control in position;   Qped: Cat/camel stretch 5 sec hold x5 reps each; Prayer stretch 20 sec hold  x2 reps;   Standing with green tband: -BUE shoulder extension x15 reps BUE shoulder low rows x15 reps BUE lat pull down x15 reps Patient able to exhibit good motor control and scapular retraction without difficulty.  Facing wall: Low "V" to high "V" wall walk green tband x5 reps Patient required min-moderate verbal/tactile cues for correct exercise technique with good tolerance reported;   Alternate UE lift off 5# x10 reps each UE with good ROM noted and no pain reported;   Patient instructed in sidelying: PT performed myofascial release to scapular paraspinals, x2 min each UE. Initially patient exhibits mild tightness however with increased  soft tissue mobilization was able to exhibit better tissue extensibility;  Patient supine:  PT instructed patient in angel wings (shoulder abduction AROM) in supine x10 reps to facilitate better UE ROM and reduce scapular tightness. Patient able to exhibit full ROM without discomfort  Initiated HEP- see patient instructions  Patient tolerated session well. She continues to exhibit good ROM and motor control. Patient denies any increase in symptoms with advanced exercise. She reports a reduction in symptoms when supine which is likely due to unweighted breast tissue.                    PT Education - 08/27/20 1023     Education Details positioning/strengthening;    Person(s) Educated Patient    Methods Explanation;Verbal cues    Comprehension Verbalized understanding;Returned demonstration;Verbal cues required;Need further instruction              PT Short Term Goals - 08/20/20 1338       PT SHORT TERM GOAL #1   Title Patient will be adherent to HEP at least 3 x a week to improve postural re-education and help reduce symptoms.    Baseline 7/11: doesn't have HEP    Time 1    Period Weeks    Status New    Target Date 08/27/20               PT Long Term Goals - 08/20/20 1339       PT LONG TERM GOAL #1   Title Patient will report a maximum of 3/10 back pain over last week to indicate improved symptoms with ADLs.    Time 4    Period Weeks    Status New    Target Date 09/17/20      PT LONG TERM GOAL #2   Title Patient will tolerate 1 hour of sitting or standing without increase in symptoms to increase tolerance with ADLs.    Time 4    Period Weeks    Status New    Target Date 09/17/20                   Plan - 08/27/20 1317     Clinical Impression Statement Patient motivated and participated well within session. She exhibits good ROM and flexibility throughout session. Patient reports continued back pain with prolonged sitting/standing  but less pain when supine, likely from less stress on spine with unweight of breast tissue. Patient instructed in advanced postural strengthening exercise. She required min VCs for proper positioning/exercise technique. Patient able to tolerate well with good control. Patient educated in proper positioning/posture with ADLs including folding clothes/laundry. She verbalized understanding. patient would benefit from skilled PT intervention to reduce discomfort and improve ADL tolerance.    Personal Factors and Comorbidities Time since onset of injury/illness/exacerbation    Examination-Activity Limitations Carry;Lift  Examination-Participation Restrictions Cleaning;Occupation;Laundry    Stability/Clinical Decision Making Stable/Uncomplicated    Rehab Potential Fair    PT Frequency 1x / week    PT Duration 4 weeks    PT Treatment/Interventions Cryotherapy;Electrical Stimulation;Moist Heat;Therapeutic exercise;Patient/family education;Manual techniques;Passive range of motion;Dry needling;Energy conservation;Taping    PT Next Visit Plan review body mechanics with laundry, advance HEP with postural strengthening, consider taping;    PT Home Exercise Plan will address next session;    Consulted and Agree with Plan of Care Patient             Patient will benefit from skilled therapeutic intervention in order to improve the following deficits and impairments:  Decreased activity tolerance, Increased fascial restricitons, Postural dysfunction, Pain, Increased muscle spasms  Visit Diagnosis: Abnormal posture  Pain in thoracic spine     Problem List Patient Active Problem List   Diagnosis Date Noted   Back pain 07/10/2020   Symptomatic mammary hypertrophy 07/10/2020   Neck pain 07/10/2020   Eustachian tube dysfunction, left 03/19/2020   Abnormal MRI 02/26/2018   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   H/O cesarean section complicating pregnancy 97/91/5041   Post-operative state 02/16/2015    Encounter for general adult medical examination with abnormal findings 01/17/2014   Migraine 09/23/2012    Sanda Dejoy PT, DPT 08/27/2020, 1:20 PM  [place] [address] [place], [place], [address] Phone: [phone]   Fax:  [phone]  Name: Kristie Woods MRN: [id number] Date of Birth: [birth year]

## 2020-08-27 NOTE — Patient Instructions (Signed)
Access Code: [email or web address] URL: [email or web address] Date: 08/27/2020 Prepared by: Blanche East  Exercises Shoulder Extension with Resistance - 1 x daily - 7 x weekly - 2 sets - 15 reps Standing Bilateral Low Shoulder Row with Anchored Resistance - 1 x daily - 7 x weekly - 2 sets - 15 reps Standing Lat Pull Down with Resistance - Arms Straight - 1 x daily - 7 x weekly - 2 sets - 15 reps Prone Shoulder Flexion - 1 x daily - 7 x weekly - 2 sets - 10 reps

## 2020-08-29 ENCOUNTER — Encounter: Payer: No Typology Code available for payment source | Admitting: Physical Therapy

## 2020-09-03 ENCOUNTER — Ambulatory Visit: Payer: No Typology Code available for payment source | Admitting: Physical Therapy

## 2020-09-05 ENCOUNTER — Encounter: Payer: No Typology Code available for payment source | Admitting: Physical Therapy

## 2020-09-10 ENCOUNTER — Other Ambulatory Visit: Payer: Self-pay

## 2020-09-10 ENCOUNTER — Ambulatory Visit: Payer: No Typology Code available for payment source | Attending: Plastic Surgery

## 2020-09-10 DIAGNOSIS — R293 Abnormal posture: Secondary | ICD-10-CM | POA: Insufficient documentation

## 2020-09-10 DIAGNOSIS — M546 Pain in thoracic spine: Secondary | ICD-10-CM | POA: Insufficient documentation

## 2020-09-10 NOTE — Therapy (Signed)
[place] MAIN [facility] SERVICES [address] [place], [place], [address] Phone: [phone]   Fax:  [phone]  September 10, 2020   No Recipients  Physical Therapy Discharge Summary Reporting period: 08/20/2020 - 09/10/2020  Patient: Kristie Woods  MRN: [id number]  Date of Birth: 1980-03-03   Diagnosis: Abnormal posture  Pain in thoracic spine Referring Provider (PT): Dillingham, Lyndee Leo DO   The above patient had been seen in Physical Therapy 3 times of 2 treatments scheduled with 0 no shows and 0 cancellations.  The treatment consisted of Postural education, energy conservation, and postural strengthening The patient is: Unchanged   Discharge Findings: Pt has made little to no progress in goals. Remains in ~4/10 pain on average with up to 8-9/10 NPS since last session with household ADL's. Has been compliant with HEP, however still no relief.  Functional Status at Discharge: Independent but remains with baseline levels of back pain.  Goals Partially Met/not met   Pt denies need for education or progression of HEP or intervention at this date. Pt denies having any questions or concerns with her time in PT with regards to her back pain. PT educated to reach out to rehab services for any future questions or need for therapy in the future.   Plan - 09/10/20 1025     Clinical Impression Statement Per primary PT and POC pt d/c from therapy today. Pt has made little to no progress towards PT goals. Pt does report minimal relief and helpuflness in pain with ADL's with postural education and rest breaks during ADL's however remains in base line level of pain overall especially with laundry. Pt compliant with HEP 3x/week, but has remained in inability to lower pain levels. Pt did report recent onset of up to 8-9/10 pain via NPS with household work. Due to no progress in PT with persistent back pain, pt being D/c'd from therapy in hopes to qualify for  breast reduction surgery for pain.    Personal Factors and Comorbidities Time since onset of injury/illness/exacerbation    Examination-Activity Limitations Carry;Lift    Examination-Participation Restrictions Cleaning;Occupation;Laundry    Stability/Clinical Decision Making Stable/Uncomplicated    Rehab Potential Fair    PT Frequency 1x / week    PT Duration 4 weeks    PT Treatment/Interventions Cryotherapy;Electrical Stimulation;Moist Heat;Therapeutic exercise;Patient/family education;Manual techniques;Passive range of motion;Dry needling;Energy conservation;Taping    PT Next Visit Plan d/c from therapy    PT Home Exercise Plan will address next session;    Consulted and Agree with Plan of Care Patient               [name]. Fairly IV, PT, DPT Physical Therapist- [place] MAIN [facility] SERVICES [address] [place], [place], [address] Phone: [phone]   Fax:  [phone]  Patient: [name]  MRN: [id number]  Date of Birth: [birth year]

## 2020-09-12 ENCOUNTER — Encounter: Payer: No Typology Code available for payment source | Admitting: Physical Therapy

## 2020-10-03 ENCOUNTER — Encounter: Payer: Self-pay | Admitting: Surgical

## 2020-10-03 ENCOUNTER — Other Ambulatory Visit: Payer: Self-pay

## 2020-10-03 ENCOUNTER — Ambulatory Visit (INDEPENDENT_AMBULATORY_CARE_PROVIDER_SITE_OTHER): Payer: No Typology Code available for payment source | Admitting: Surgical

## 2020-10-03 VITALS — Wt 147.8 lb

## 2020-10-03 DIAGNOSIS — N62 Hypertrophy of breast: Secondary | ICD-10-CM

## 2020-10-03 DIAGNOSIS — M546 Pain in thoracic spine: Secondary | ICD-10-CM

## 2020-10-03 DIAGNOSIS — M542 Cervicalgia: Secondary | ICD-10-CM | POA: Diagnosis not present

## 2020-10-03 DIAGNOSIS — G8929 Other chronic pain: Secondary | ICD-10-CM

## 2020-10-03 NOTE — Progress Notes (Signed)
   Referring Provider Leone Haven, MD [address] [place],  [place] [address]   CC:  Chief Complaint  Patient presents with   Follow-up      Kristie Woods is an 40 y.o. female.  HPI: Patient is a 40 y.o. year old female here for follow up after completing physical therapy for pain related to macromastia. She reports that she is doing well.  She has completed physical therapy, reports that she had some mild improvement but is still having daily neck, back, upper mid back and shoulder pain.  She reports that the bra straps cause some shoulder pain.  She also reports that she has a history of rashes beneath her breast.  She reports that she is still interested in pursuing surgical intervention.  Review of Systems Review of Systems  Musculoskeletal:  Positive for back pain and neck pain.    Physical Exam Vitals with BMI 10/03/2020 07/10/2020 03/29/2020  Height - '5\' 4"'$  -  Weight 147 lbs 13 oz 145 lbs 13 oz -  BMI - 0000000 -  Systolic - [id number]  Diastolic - 75 66  Pulse - 70 80    General:  No acute distress,  Alert and oriented, Non-Toxic, Normal speech and affect Psych: Normal behavior and mood    Assessment/Plan  Patient is interested in pursuing surgical intervention for bilateral breast reduction.  Patient reports she has completed physical therapy for pain related to mammary hypertrophy.  Discussed with patient we would submit to insurance for authorization, discussed approval could take up to 6 weeks.   Recommend calling with questions or concerns.  Kristie Woods 10/03/2020, 3:16 PM

## 2020-11-27 NOTE — Progress Notes (Signed)
Patient ID: Kristie Woods, female    DOB: 1980/05/17, 40 y.o.   MRN: [id number]  Chief Complaint  Patient presents with   Pre-op Exam       ICD-10-CM   1. S/P bilateral breast reduction  Z98.890        History of Present Illness: Kristie Woods is a 40 y.o.  female  with a history of symptomatic mammary hypertrophy.  She presents for preoperative evaluation for upcoming procedure, bilateral breast reduction with liposuction, scheduled for 12/20/2020 with Dr. Marla Roe.  The patient has not had problems with anesthesia.  She has had multiple surgical interventions without complication or difficulty from anesthesia.  She states that her father had stroke, but denies any other personal or family history of thrombosis.  No known hypercoagulable states or familial hyperlipidemia.  She does not take birth control or hormone replacement.  She endorses a blistering rash from tape adhesives.  She is requesting paper tape be used.  Summary of Previous Visit: Patient was seen for initial consult on 07/10/2020.  At that time, she complained of back and neck discomfort in context of her large breasts.  Preoperative bra size equal DDD cup.  She expressed that she would like to be a C cup.  Estimated excess breast tissue to be removed at time of surgery equals 400 g on each side.  Mammogram 06/2020 was negative.  No family history of breast cancer.  Denies tobacco use.  STN 29 cm bilaterally.  Patient then completed 6 weeks physical therapy and was reevaluated 10/03/2020.  At that time, she expressed continued interest in surgical intervention for her macromastia.  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  Arranging for 2 weeks off from work.  PMH Significant for: Macromastia, GERD s/p Nissen fundoplication.   Past Medical History: Allergies: Allergies  Allergen Reactions   Nitrofurantoin Hives     Current Medications:  Current Outpatient Medications:    ergocalciferol (VITAMIN D2) 1.25 MG (50000 UT) capsule, Take 1 capsule by mouth once a week., Disp: , Rfl:   Past Medical Problems: Past Medical History:  Diagnosis Date   Anemia    GERD (gastroesophageal reflux disease)    H/O colposcopy with cervical biopsy 2009   [facility]   Hyperlipidemia    Kidney stones 2011   Migraine    occasional, no regular meds, uses OTC Excedrine Migraine    Past Surgical History: Past Surgical History:  Procedure Laterality Date   APPENDECTOMY     CESAREAN SECTION  2013   NRFHT   CESAREAN SECTION N/A 02/16/2015   Procedure: CESAREAN SECTION;  Surgeon: Boykin Nearing, MD;  Location: [facility] ORS;  Service: Obstetrics;  Laterality: N/A;   NISSEN FUNDOPLICATION  [id number]    Social History: Social History   Socioeconomic History   Marital status: Married    Spouse name: Not on file   Number of children: Not on file   Years of education: Not on file   Highest education level: Not on file  Occupational History   Not on file  Tobacco Use   Smoking status: Former    Packs/day: 0.25    Years: 10.00    Pack years: 2.50    Types: Cigarettes    Quit date: 01/15/2015    Years since quitting: 5.8   Smokeless tobacco: Never  Substance and Sexual Activity  Alcohol use: Yes    Comment: Occassionally   Drug use: No   Sexual activity: Yes  Other Topics Concern   Not on file  Social History Narrative   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, 4months, Comptroller      Work - [employer] - healthy      Exercise - soccer, Oncologist, walks            Social Determinants of Health   Financial Resource Strain: Not on file  Food Insecurity: Not on file  Transportation Needs: Not on file  Physical Activity: Not on file  Stress: Not on file  Social Connections: Not on file  Intimate Partner Violence: Not on file    Family History: Family History  Problem Relation  Age of Onset   Hyperlipidemia Mother    Thyroid disease Mother    Heart disease Father    Stroke Father 39   Hyperlipidemia Father    Crohn's disease Brother    Alcohol abuse Maternal Grandmother    Ulcerative colitis Brother    Breast cancer Neg Hx     Review of Systems: Review of Systems  All other systems reviewed and are negative.   Physical Exam: Vital Signs BP 121/81 (BP Location: Left Arm, Patient Position: Sitting, Cuff Size: Normal)   Pulse 70   Ht 5\' 4"  (1.626 m)   Wt 149 lb 6.4 oz (67.8 kg)   SpO2 (!) 88%   BMI 25.64 kg/m   Physical Exam  Constitutional:      General: Not in acute distress.    Appearance: Normal appearance. Not ill-appearing.  HENT:     Head: Normocephalic and atraumatic.  Eyes:     Pupils: Pupils are equal, round Neck:     Musculoskeletal: Normal range of motion.  Cardiovascular:     Rate and Rhythm: Normal rate Pulmonary:     Effort: Pulmonary effort is normal. No respiratory distress.  Abdominal:     General: Abdomen is flat. There is no distension.  Musculoskeletal: Normal range of motion.  Ambulatory. Skin:    General: Skin is warm and dry.     Findings: No erythema or rash.  Neurological:     General: No focal deficit present.     Mental Status: Alert and oriented to person, place, and time. Mental status is at baseline.     Motor: No weakness.  Psychiatric:        Mood and Affect: Mood normal.        Behavior: Behavior normal.    Assessment/Plan: The patient is scheduled for bilateral breast reduction with liposuction 12/20/2020 with Dr. Marla Roe.  Risks, benefits, and alternatives of procedure discussed, questions answered and consent obtained.    Smoking Status: Smokes socially.  Has not smoked in 2 months.  We will continue to abstain until she has full recovered from her surgery and is cleared of our services. Last Mammogram: 06/2020; Results: BI-RADS Category 1, negative.  Caprini Score: 4; Risk Factors include:  Age, BMI greater than 25 (barely), and length of planned surgery.  She reports that her father passed away from a stroke, but denies any other personal or family history of thrombosis.  Do not consider that to be a strong risk factor for her.  Recommendation for mechanical prophylaxis. Encourage early ambulation.   Pictures obtained: 07/10/2020  Post-op Rx sent to pharmacy: Tramadol, Zofran, Keflex.  Patient was provided with the General Surgical Risk consent document  and Pain Medication Agreement prior to their appointment.  They had adequate time to read through the risk consent documents and Pain Medication Agreement. We also discussed them in person together during this preop appointment. All of their questions were answered to their satisfaction.  Recommended calling if they have any further questions.  Risk consent form and Pain Medication Agreement to be scanned into patient's chart.  The risk that can be encountered with breast reduction were discussed and include the following but not limited to these:  Breast asymmetry, fluid accumulation, firmness of the breast, inability to breast feed, loss of nipple or areola, skin loss, decrease or no nipple sensation, fat necrosis of the breast tissue, bleeding, infection, healing delay.  There are risks of anesthesia, changes to skin sensation and injury to nerves or blood vessels.  The muscle can be temporarily or permanently injured.  You may have an allergic reaction to tape, suture, glue, blood products which can result in skin discoloration, swelling, pain, skin lesions, poor healing.  Any of these can lead to the need for revisonal surgery or stage procedures.  A reduction has potential to interfere with diagnostic procedures.  Nipple or breast piercing can increase risks of infection.  This procedure is best done when the breast is fully developed.  Changes in the breast will continue to occur over time.  Pregnancy can alter the outcomes of previous  breast reduction surgery, weight gain and weigh loss can also effect the long term appearance.     Electronically signed by: Krista Blue, PA-C 11/30/2020 12:48 PM

## 2020-11-27 NOTE — H&P (View-Only) (Signed)
Patient ID: Kristie Woods, female    DOB: Jan 30, 1981, 40 y.o.   MRN: [id number]  Chief Complaint  Patient presents with   Pre-op Exam       ICD-10-CM   1. S/P bilateral breast reduction  Z98.890        History of Present Illness: Kristie Woods is a 40 y.o.  female  with a history of symptomatic mammary hypertrophy.  She presents for preoperative evaluation for upcoming procedure, bilateral breast reduction with liposuction, scheduled for 12/20/2020 with Dr. Marla Roe.  The patient has not had problems with anesthesia.  She has had multiple surgical interventions without complication or difficulty from anesthesia.  She states that her father had stroke, but denies any other personal or family history of thrombosis.  No known hypercoagulable states or familial hyperlipidemia.  She does not take birth control or hormone replacement.  She endorses a blistering rash from tape adhesives.  She is requesting paper tape be used.  Summary of Previous Visit: Patient was seen for initial consult on 07/10/2020.  At that time, she complained of back and neck discomfort in context of her large breasts.  Preoperative bra size equal DDD cup.  She expressed that she would like to be a C cup.  Estimated excess breast tissue to be removed at time of surgery equals 400 g on each side.  Mammogram 06/2020 was negative.  No family history of breast cancer.  Denies tobacco use.  STN 29 cm bilaterally.  Patient then completed 6 weeks physical therapy and was reevaluated 10/03/2020.  At that time, she expressed continued interest in surgical intervention for her macromastia.  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  Arranging for 2 weeks off from work.  PMH Significant for: Macromastia, GERD s/p Nissen fundoplication.   Past Medical History: Allergies: Allergies  Allergen Reactions   Nitrofurantoin Hives     Current Medications:  Current Outpatient Medications:    ergocalciferol (VITAMIN D2) 1.25 MG (50000 UT) capsule, Take 1 capsule by mouth once a week., Disp: , Rfl:   Past Medical Problems: Past Medical History:  Diagnosis Date   Anemia    GERD (gastroesophageal reflux disease)    H/O colposcopy with cervical biopsy 2009   [facility]   Hyperlipidemia    Kidney stones 2011   Migraine    occasional, no regular meds, uses OTC Excedrine Migraine    Past Surgical History: Past Surgical History:  Procedure Laterality Date   APPENDECTOMY     CESAREAN SECTION  2013   NRFHT   CESAREAN SECTION N/A 02/16/2015   Procedure: CESAREAN SECTION;  Surgeon: Boykin Nearing, MD;  Location: [facility] ORS;  Service: Obstetrics;  Laterality: N/A;   NISSEN FUNDOPLICATION  2458    Social History: Social History   Socioeconomic History   Marital status: Married    Spouse name: Not on file   Number of children: Not on file   Years of education: Not on file   Highest education level: Not on file  Occupational History   Not on file  Tobacco Use   Smoking status: Former    Packs/day: 0.25    Years: 10.00    Pack years: 2.50    Types: Cigarettes    Quit date: 01/15/2015    Years since quitting: 5.8   Smokeless tobacco: Never  Substance and Sexual Activity  Alcohol use: Yes    Comment: Occassionally   Drug use: No   Sexual activity: Yes  Other Topics Concern   Not on file  Social History Narrative   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, 47months, Comptroller      Work - [employer] - healthy      Exercise - soccer, Oncologist, walks            Social Determinants of Health   Financial Resource Strain: Not on file  Food Insecurity: Not on file  Transportation Needs: Not on file  Physical Activity: Not on file  Stress: Not on file  Social Connections: Not on file  Intimate Partner Violence: Not on file    Family History: Family History  Problem Relation  Age of Onset   Hyperlipidemia Mother    Thyroid disease Mother    Heart disease Father    Stroke Father 32   Hyperlipidemia Father    Crohn's disease Brother    Alcohol abuse Maternal Grandmother    Ulcerative colitis Brother    Breast cancer Neg Hx     Review of Systems: Review of Systems  All other systems reviewed and are negative.   Physical Exam: Vital Signs BP 121/81 (BP Location: Left Arm, Patient Position: Sitting, Cuff Size: Normal)   Pulse 70   Ht 5\' 4"  (1.626 m)   Wt 149 lb 6.4 oz (67.8 kg)   SpO2 (!) 88%   BMI 25.64 kg/m   Physical Exam  Constitutional:      General: Not in acute distress.    Appearance: Normal appearance. Not ill-appearing.  HENT:     Head: Normocephalic and atraumatic.  Eyes:     Pupils: Pupils are equal, round Neck:     Musculoskeletal: Normal range of motion.  Cardiovascular:     Rate and Rhythm: Normal rate Pulmonary:     Effort: Pulmonary effort is normal. No respiratory distress.  Abdominal:     General: Abdomen is flat. There is no distension.  Musculoskeletal: Normal range of motion.  Ambulatory. Skin:    General: Skin is warm and dry.     Findings: No erythema or rash.  Neurological:     General: No focal deficit present.     Mental Status: Alert and oriented to person, place, and time. Mental status is at baseline.     Motor: No weakness.  Psychiatric:        Mood and Affect: Mood normal.        Behavior: Behavior normal.    Assessment/Plan: The patient is scheduled for bilateral breast reduction with liposuction 12/20/2020 with Dr. Marla Roe.  Risks, benefits, and alternatives of procedure discussed, questions answered and consent obtained.    Smoking Status: Smokes socially.  Has not smoked in 2 months.  We will continue to abstain until she has full recovered from her surgery and is cleared of our services. Last Mammogram: 06/2020; Results: BI-RADS Category 1, negative.  Caprini Score: 4; Risk Factors include:  Age, BMI greater than 25 (barely), and length of planned surgery.  She reports that her father passed away from a stroke, but denies any other personal or family history of thrombosis.  Do not consider that to be a strong risk factor for her.  Recommendation for mechanical prophylaxis. Encourage early ambulation.   Pictures obtained: 07/10/2020  Post-op Rx sent to pharmacy: Tramadol, Zofran, Keflex.  Patient was provided with the General Surgical Risk consent document  and Pain Medication Agreement prior to their appointment.  They had adequate time to read through the risk consent documents and Pain Medication Agreement. We also discussed them in person together during this preop appointment. All of their questions were answered to their satisfaction.  Recommended calling if they have any further questions.  Risk consent form and Pain Medication Agreement to be scanned into patient's chart.  The risk that can be encountered with breast reduction were discussed and include the following but not limited to these:  Breast asymmetry, fluid accumulation, firmness of the breast, inability to breast feed, loss of nipple or areola, skin loss, decrease or no nipple sensation, fat necrosis of the breast tissue, bleeding, infection, healing delay.  There are risks of anesthesia, changes to skin sensation and injury to nerves or blood vessels.  The muscle can be temporarily or permanently injured.  You may have an allergic reaction to tape, suture, glue, blood products which can result in skin discoloration, swelling, pain, skin lesions, poor healing.  Any of these can lead to the need for revisonal surgery or stage procedures.  A reduction has potential to interfere with diagnostic procedures.  Nipple or breast piercing can increase risks of infection.  This procedure is best done when the breast is fully developed.  Changes in the breast will continue to occur over time.  Pregnancy can alter the outcomes of previous  breast reduction surgery, weight gain and weigh loss can also effect the long term appearance.     Electronically signed by: Krista Blue, PA-C 11/30/2020 12:48 PM

## 2020-11-30 ENCOUNTER — Other Ambulatory Visit: Payer: Self-pay

## 2020-11-30 ENCOUNTER — Ambulatory Visit (INDEPENDENT_AMBULATORY_CARE_PROVIDER_SITE_OTHER): Payer: No Typology Code available for payment source | Admitting: Physician Assistant

## 2020-11-30 ENCOUNTER — Encounter: Payer: Self-pay | Admitting: Physician Assistant

## 2020-11-30 VITALS — BP 121/81 | HR 70 | Ht 64.0 in | Wt 149.4 lb

## 2020-11-30 DIAGNOSIS — Z9889 Other specified postprocedural states: Secondary | ICD-10-CM

## 2020-11-30 MED ORDER — CEPHALEXIN 500 MG PO CAPS
500.0000 mg | ORAL_CAPSULE | Freq: Four times a day (QID) | ORAL | 0 refills | Status: AC
  Filled 2020-11-30: qty 12, 3d supply, fill #0

## 2020-11-30 MED ORDER — ONDANSETRON 4 MG PO TBDP
4.0000 mg | ORAL_TABLET | Freq: Three times a day (TID) | ORAL | 0 refills | Status: DC | PRN
  Filled 2020-11-30: qty 12, 4d supply, fill #0

## 2020-11-30 MED ORDER — TRAMADOL HCL 50 MG PO TABS
50.0000 mg | ORAL_TABLET | Freq: Three times a day (TID) | ORAL | 0 refills | Status: AC | PRN
  Filled 2020-11-30: qty 20, 7d supply, fill #0

## 2020-12-11 ENCOUNTER — Other Ambulatory Visit: Payer: Self-pay

## 2020-12-12 ENCOUNTER — Encounter (HOSPITAL_BASED_OUTPATIENT_CLINIC_OR_DEPARTMENT_OTHER): Payer: Self-pay | Admitting: Plastic Surgery

## 2020-12-12 ENCOUNTER — Other Ambulatory Visit: Payer: Self-pay

## 2020-12-19 NOTE — Anesthesia Preprocedure Evaluation (Addendum)
Anesthesia Evaluation  Patient identified by MRN, date of birth, ID band Patient awake    Reviewed: Allergy & Precautions, NPO status , Patient's Chart, lab work & pertinent test results  History of Anesthesia Complications Negative for: history of anesthetic complications  Airway Mallampati: II  TM Distance: >3 FB Neck ROM: Full    Dental  (+) Dental Advisory Given, Teeth Intact   Pulmonary former smoker,    Pulmonary exam normal        Cardiovascular negative cardio ROS Normal cardiovascular exam     Neuro/Psych  Headaches, PSYCHIATRIC DISORDERS Anxiety    GI/Hepatic Neg liver ROS, GERD  Controlled, S/p nissen fundoplication    Endo/Other  negative endocrine ROS  Renal/GU negative Renal ROS     Musculoskeletal negative musculoskeletal ROS (+)   Abdominal   Peds  Hematology negative hematology ROS (+)   Anesthesia Other Findings   Reproductive/Obstetrics                           Anesthesia Physical Anesthesia Plan  ASA: 1  Anesthesia Plan: General   Post-op Pain Management:    Induction: Intravenous  PONV Risk Score and Plan: 3 and Treatment may vary due to age or medical condition, Ondansetron, Dexamethasone, Midazolam and Scopolamine patch - Pre-op  Airway Management Planned: LMA  Additional Equipment: None  Intra-op Plan:   Post-operative Plan: Extubation in OR  Informed Consent: I have reviewed the patients History and Physical, chart, labs and discussed the procedure including the risks, benefits and alternatives for the proposed anesthesia with the patient or authorized representative who has indicated his/her understanding and acceptance.     Dental advisory given  Plan Discussed with: CRNA and Anesthesiologist  Anesthesia Plan Comments:        Anesthesia Quick Evaluation

## 2020-12-20 ENCOUNTER — Ambulatory Visit (HOSPITAL_BASED_OUTPATIENT_CLINIC_OR_DEPARTMENT_OTHER): Payer: No Typology Code available for payment source | Admitting: Anesthesiology

## 2020-12-20 ENCOUNTER — Encounter (HOSPITAL_BASED_OUTPATIENT_CLINIC_OR_DEPARTMENT_OTHER)
Admission: RE | Disposition: A | Discharge status: Home / Self Care | Payer: Self-pay | Source: Home / Self Care | Attending: Plastic Surgery

## 2020-12-20 ENCOUNTER — Encounter (HOSPITAL_BASED_OUTPATIENT_CLINIC_OR_DEPARTMENT_OTHER): Payer: Self-pay | Admitting: Plastic Surgery

## 2020-12-20 ENCOUNTER — Ambulatory Visit (HOSPITAL_BASED_OUTPATIENT_CLINIC_OR_DEPARTMENT_OTHER)
Admission: RE | Admit: 2020-12-20 | Discharge: 2020-12-20 | Disposition: A | Discharge status: Home / Self Care | Payer: No Typology Code available for payment source | Attending: Plastic Surgery | Admitting: Plastic Surgery

## 2020-12-20 ENCOUNTER — Other Ambulatory Visit: Payer: Self-pay

## 2020-12-20 DIAGNOSIS — G8929 Other chronic pain: Secondary | ICD-10-CM

## 2020-12-20 DIAGNOSIS — F17210 Nicotine dependence, cigarettes, uncomplicated: Secondary | ICD-10-CM | POA: Insufficient documentation

## 2020-12-20 DIAGNOSIS — N62 Hypertrophy of breast: Secondary | ICD-10-CM | POA: Insufficient documentation

## 2020-12-20 DIAGNOSIS — M546 Pain in thoracic spine: Secondary | ICD-10-CM

## 2020-12-20 DIAGNOSIS — M542 Cervicalgia: Secondary | ICD-10-CM

## 2020-12-20 HISTORY — PX: BREAST REDUCTION SURGERY: SHX8

## 2020-12-20 LAB — POCT PREGNANCY, URINE: Preg Test, Ur: NEGATIVE

## 2020-12-20 SURGERY — BREAST REDUCTION WITH LIPOSUCTION
Anesthesia: General | Site: Breast | Laterality: Bilateral

## 2020-12-20 MED ORDER — SODIUM CHLORIDE 0.9% FLUSH: 3.0000 mL | INTRAVENOUS | Status: DC | PRN

## 2020-12-20 MED ORDER — PROPOFOL 10 MG/ML IV BOLUS
INTRAVENOUS | Status: AC
  Filled 2020-12-20: qty 20

## 2020-12-20 MED ORDER — LIDOCAINE HCL 1 % IJ SOLN
INTRAVENOUS | Status: DC | PRN
  Administered 2020-12-20: 150 mL

## 2020-12-20 MED ORDER — MIDAZOLAM HCL 2 MG/2ML IJ SOLN
INTRAMUSCULAR | Status: AC
  Filled 2020-12-20: qty 2

## 2020-12-20 MED ORDER — SODIUM CHLORIDE 0.9 % IV SOLN: 250.0000 mL | INTRAVENOUS | Status: DC | PRN

## 2020-12-20 MED ORDER — ACETAMINOPHEN 325 MG PO TABS: 650.0000 mg | ORAL_TABLET | ORAL | Status: DC | PRN

## 2020-12-20 MED ORDER — ACETAMINOPHEN 500 MG PO TABS
ORAL_TABLET | ORAL | Status: AC
  Filled 2020-12-20: qty 2

## 2020-12-20 MED ORDER — EPINEPHRINE PF 1 MG/ML IJ SOLN
INTRAMUSCULAR | Status: AC
  Filled 2020-12-20: qty 1

## 2020-12-20 MED ORDER — PROMETHAZINE HCL 25 MG/ML IJ SOLN: 6.2500 mg | INTRAMUSCULAR | Status: DC | PRN

## 2020-12-20 MED ORDER — FENTANYL CITRATE (PF) 100 MCG/2ML IJ SOLN
INTRAMUSCULAR | Status: DC | PRN
  Administered 2020-12-20 (×2): 25 ug via INTRAVENOUS
  Administered 2020-12-20: 50 ug via INTRAVENOUS
  Administered 2020-12-20: 25 ug via INTRAVENOUS
  Administered 2020-12-20: 50 ug via INTRAVENOUS
  Administered 2020-12-20: 25 ug via INTRAVENOUS

## 2020-12-20 MED ORDER — FENTANYL CITRATE (PF) 100 MCG/2ML IJ SOLN
INTRAMUSCULAR | Status: AC
  Filled 2020-12-20: qty 2

## 2020-12-20 MED ORDER — LIDOCAINE HCL (PF) 1 % IJ SOLN
INTRAMUSCULAR | Status: AC
  Filled 2020-12-20: qty 60

## 2020-12-20 MED ORDER — PROPOFOL 10 MG/ML IV BOLUS
INTRAVENOUS | Status: DC | PRN
  Administered 2020-12-20: 180 mg via INTRAVENOUS

## 2020-12-20 MED ORDER — CEFAZOLIN SODIUM-DEXTROSE 2-4 GM/100ML-% IV SOLN
2.0000 g | INTRAVENOUS | Status: AC
  Administered 2020-12-20: 2 g via INTRAVENOUS

## 2020-12-20 MED ORDER — OXYCODONE HCL 5 MG PO TABS: 5.0000 mg | ORAL_TABLET | Freq: Once | ORAL | Status: DC | PRN

## 2020-12-20 MED ORDER — LIDOCAINE-EPINEPHRINE 1 %-1:100000 IJ SOLN
INTRAMUSCULAR | Status: AC
  Filled 2020-12-20: qty 1

## 2020-12-20 MED ORDER — CEFAZOLIN SODIUM-DEXTROSE 2-4 GM/100ML-% IV SOLN
INTRAVENOUS | Status: AC
  Filled 2020-12-20: qty 100

## 2020-12-20 MED ORDER — BUPIVACAINE-EPINEPHRINE (PF) 0.25% -1:200000 IJ SOLN
INTRAMUSCULAR | Status: AC
  Filled 2020-12-20: qty 60

## 2020-12-20 MED ORDER — ONDANSETRON HCL 4 MG/2ML IJ SOLN
INTRAMUSCULAR | Status: AC
  Filled 2020-12-20: qty 2

## 2020-12-20 MED ORDER — FENTANYL CITRATE (PF) 100 MCG/2ML IJ SOLN: 25.0000 ug | INTRAMUSCULAR | Status: DC | PRN

## 2020-12-20 MED ORDER — ONDANSETRON HCL 4 MG/2ML IJ SOLN
INTRAMUSCULAR | Status: DC | PRN
  Administered 2020-12-20: 4 mg via INTRAVENOUS

## 2020-12-20 MED ORDER — LACTATED RINGERS IV SOLN: INTRAVENOUS | Status: DC

## 2020-12-20 MED ORDER — ACETAMINOPHEN 325 MG RE SUPP: 650.0000 mg | RECTAL | Status: DC | PRN

## 2020-12-20 MED ORDER — LIDOCAINE HCL 1 % IJ SOLN
INTRAMUSCULAR | Status: DC | PRN
  Administered 2020-12-20: 60 mg via INTRADERMAL

## 2020-12-20 MED ORDER — SODIUM CHLORIDE 0.9% FLUSH: 3.0000 mL | Freq: Two times a day (BID) | INTRAVENOUS | Status: DC

## 2020-12-20 MED ORDER — OXYCODONE HCL 5 MG PO TABS: 5.0000 mg | ORAL_TABLET | ORAL | Status: DC | PRN

## 2020-12-20 MED ORDER — DEXAMETHASONE SODIUM PHOSPHATE 4 MG/ML IJ SOLN
INTRAMUSCULAR | Status: DC | PRN
  Administered 2020-12-20: 4 mg via INTRAVENOUS

## 2020-12-20 MED ORDER — OXYCODONE HCL 5 MG/5ML PO SOLN: 5.0000 mg | Freq: Once | ORAL | Status: DC | PRN

## 2020-12-20 MED ORDER — DEXMEDETOMIDINE (PRECEDEX) IN NS 20 MCG/5ML (4 MCG/ML) IV SYRINGE
PREFILLED_SYRINGE | INTRAVENOUS | Status: DC | PRN
Start: 2020-12-20 — End: 2020-12-20
  Administered 2020-12-20 (×3): 4 ug via INTRAVENOUS

## 2020-12-20 MED ORDER — CHLORHEXIDINE GLUCONATE CLOTH 2 % EX PADS: 6.0000 | MEDICATED_PAD | Freq: Once | CUTANEOUS | Status: DC

## 2020-12-20 MED ORDER — LIDOCAINE 2% (20 MG/ML) 5 ML SYRINGE
INTRAMUSCULAR | Status: AC
  Filled 2020-12-20: qty 5

## 2020-12-20 MED ORDER — MIDAZOLAM HCL 5 MG/5ML IJ SOLN
INTRAMUSCULAR | Status: DC | PRN
  Administered 2020-12-20 (×2): 1 mg via INTRAVENOUS

## 2020-12-20 MED ORDER — ACETAMINOPHEN 500 MG PO TABS
1000.0000 mg | ORAL_TABLET | Freq: Once | ORAL | Status: AC
  Administered 2020-12-20: 1000 mg via ORAL

## 2020-12-20 MED ORDER — LIDOCAINE-EPINEPHRINE 1 %-1:100000 IJ SOLN
INTRAMUSCULAR | Status: DC | PRN
  Administered 2020-12-20: 20 mL via SUBCUTANEOUS

## 2020-12-20 MED ORDER — SODIUM CHLORIDE (PF) 0.9 % IJ SOLN
INTRAMUSCULAR | Status: AC
  Filled 2020-12-20: qty 30

## 2020-12-20 MED ORDER — DEXAMETHASONE SODIUM PHOSPHATE 10 MG/ML IJ SOLN
INTRAMUSCULAR | Status: AC
  Filled 2020-12-20: qty 1

## 2020-12-20 SURGICAL SUPPLY — 82 items
ADH SKN CLS APL DERMABOND .7 (GAUZE/BANDAGES/DRESSINGS) ×8
BAG DECANTER FOR FLEXI CONT (MISCELLANEOUS) IMPLANT
BINDER ABDOMINAL  9 SM 30-45 (SOFTGOODS)
BINDER ABDOMINAL 10 UNV 27-48 (MISCELLANEOUS) IMPLANT
BINDER ABDOMINAL 12 SM 30-45 (SOFTGOODS) IMPLANT
BINDER ABDOMINAL 9 SM 30-45 (SOFTGOODS) IMPLANT
BINDER BREAST LRG (GAUZE/BANDAGES/DRESSINGS) ×3 IMPLANT
BINDER BREAST MEDIUM (GAUZE/BANDAGES/DRESSINGS) IMPLANT
BINDER BREAST XLRG (GAUZE/BANDAGES/DRESSINGS) IMPLANT
BINDER BREAST XXLRG (GAUZE/BANDAGES/DRESSINGS) IMPLANT
BIOPATCH RED 1 DISK 7.0 (GAUZE/BANDAGES/DRESSINGS) IMPLANT
BLADE HEX COATED 2.75 (ELECTRODE) ×3 IMPLANT
BLADE KNIFE PERSONA 10 (BLADE) ×6 IMPLANT
BLADE SURG 15 STRL LF DISP TIS (BLADE) ×2 IMPLANT
BLADE SURG 15 STRL SS (BLADE) ×3
CANISTER SUCT 1200ML W/VALVE (MISCELLANEOUS) ×3 IMPLANT
COVER BACK TABLE 60X90IN (DRAPES) ×3 IMPLANT
COVER MAYO STAND STRL (DRAPES) ×3 IMPLANT
DECANTER SPIKE VIAL GLASS SM (MISCELLANEOUS) IMPLANT
DERMABOND ADVANCED (GAUZE/BANDAGES/DRESSINGS) ×4
DERMABOND ADVANCED .7 DNX12 (GAUZE/BANDAGES/DRESSINGS) ×8 IMPLANT
DRAIN CHANNEL 19F RND (DRAIN) IMPLANT
DRAPE LAPAROSCOPIC ABDOMINAL (DRAPES) ×3 IMPLANT
DRSG OPSITE POSTOP 4X6 (GAUZE/BANDAGES/DRESSINGS) ×6 IMPLANT
DRSG PAD ABDOMINAL 8X10 ST (GAUZE/BANDAGES/DRESSINGS) ×12 IMPLANT
ELECT BLADE 4.0 EZ CLEAN MEGAD (MISCELLANEOUS) ×3
ELECT REM PT RETURN 9FT ADLT (ELECTROSURGICAL) ×3
ELECTRODE BLDE 4.0 EZ CLN MEGD (MISCELLANEOUS) ×2 IMPLANT
ELECTRODE REM PT RTRN 9FT ADLT (ELECTROSURGICAL) ×2 IMPLANT
EVACUATOR SILICONE 100CC (DRAIN) IMPLANT
EXTRACTOR CANIST REVOLVE STRL (CANNISTER) ×3 IMPLANT
GAUZE SPONGE 4X4 12PLY STRL LF (GAUZE/BANDAGES/DRESSINGS) ×6 IMPLANT
GLOVE SURG ENC MOIS LTX SZ6.5 (GLOVE) ×9 IMPLANT
GLOVE SURG ENC MOIS LTX SZ7.5 (GLOVE) ×6 IMPLANT
GLOVE SURG ENC TEXT LTX SZ7.5 (GLOVE) ×3 IMPLANT
GLOVE SURG POLYISO LF SZ6.5 (GLOVE) ×3 IMPLANT
GLOVE SURG POLYISO LF SZ7 (GLOVE) ×3 IMPLANT
GLOVE SURG UNDER POLY LF SZ7 (GLOVE) ×9 IMPLANT
GOWN STRL REUS W/ TWL LRG LVL3 (GOWN DISPOSABLE) ×6 IMPLANT
GOWN STRL REUS W/ TWL XL LVL3 (GOWN DISPOSABLE) ×4 IMPLANT
GOWN STRL REUS W/TWL LRG LVL3 (GOWN DISPOSABLE) ×9
GOWN STRL REUS W/TWL XL LVL3 (GOWN DISPOSABLE) ×6
IV LACTATED RINGERS 1000ML (IV SOLUTION) ×6 IMPLANT
LINER CANISTER 1000CC FLEX (MISCELLANEOUS) ×3 IMPLANT
NDL SAFETY ECLIPSE 18X1.5 (NEEDLE) ×2 IMPLANT
NEEDLE FILTER BLUNT 18X 1/2SAF (NEEDLE) ×1
NEEDLE FILTER BLUNT 18X1 1/2 (NEEDLE) ×2 IMPLANT
NEEDLE HYPO 18GX1.5 SHARP (NEEDLE) ×3
NEEDLE HYPO 25X1 1.5 SAFETY (NEEDLE) ×3 IMPLANT
NEEDLE SPNL 18GX3.5 QUINCKE PK (NEEDLE) IMPLANT
NS IRRIG 1000ML POUR BTL (IV SOLUTION) ×3 IMPLANT
PACK BASIN DAY SURGERY FS (CUSTOM PROCEDURE TRAY) ×3 IMPLANT
PAD ALCOHOL SWAB (MISCELLANEOUS) ×3 IMPLANT
PAD FOAM SILICONE BACKED (GAUZE/BANDAGES/DRESSINGS) ×6 IMPLANT
PENCIL SMOKE EVACUATOR (MISCELLANEOUS) ×6 IMPLANT
PIN SAFETY STERILE (MISCELLANEOUS) IMPLANT
SLEEVE SCD COMPRESS KNEE MED (STOCKING) ×3 IMPLANT
SPONGE T-LAP 18X18 ~~LOC~~+RFID (SPONGE) ×12 IMPLANT
STRIP SUTURE WOUND CLOSURE 1/2 (MISCELLANEOUS) ×15 IMPLANT
SUT MNCRL AB 4-0 PS2 18 (SUTURE) ×24 IMPLANT
SUT MON AB 3-0 SH 27 (SUTURE) ×21
SUT MON AB 3-0 SH27 (SUTURE) ×14 IMPLANT
SUT MON AB 5-0 PS2 18 (SUTURE) ×6 IMPLANT
SUT PDS 3-0 CT2 (SUTURE) ×9
SUT PDS AB 2-0 CT2 27 (SUTURE) IMPLANT
SUT PDS II 3-0 CT2 27 ABS (SUTURE) ×6 IMPLANT
SUT SILK 3 0 PS 1 (SUTURE) IMPLANT
SYR 10ML LL (SYRINGE) IMPLANT
SYR 3ML 18GX1 1/2 (SYRINGE) IMPLANT
SYR 3ML 23GX1 SAFETY (SYRINGE) IMPLANT
SYR 50ML LL SCALE MARK (SYRINGE) ×3 IMPLANT
SYR BULB IRRIG 60ML STRL (SYRINGE) ×3 IMPLANT
SYR CONTROL 10ML LL (SYRINGE) ×3 IMPLANT
SYR TOOMEY 50ML (SYRINGE) IMPLANT
TAPE MEASURE VINYL STERILE (MISCELLANEOUS) IMPLANT
TOWEL GREEN STERILE FF (TOWEL DISPOSABLE) ×6 IMPLANT
TRAY DSU PREP LF (CUSTOM PROCEDURE TRAY) ×3 IMPLANT
TUBE CONNECTING 20X1/4 (TUBING) ×3 IMPLANT
TUBING INFILTRATION IT-10001 (TUBING) ×3 IMPLANT
TUBING SET GRADUATE ASPIR 12FT (MISCELLANEOUS) ×3 IMPLANT
UNDERPAD 30X36 HEAVY ABSORB (UNDERPADS AND DIAPERS) ×6 IMPLANT
YANKAUER SUCT BULB TIP NO VENT (SUCTIONS) ×3 IMPLANT

## 2020-12-20 NOTE — Transfer of Care (Signed)
Immediate Anesthesia Transfer of Care Note  Patient: Kristie Woods  Procedure(s) Performed: Bilateral breast reduction with liposuction (Bilateral: Breast)  Patient Location: PACU  Anesthesia Type:General  Level of Consciousness: oriented, drowsy and patient cooperative  Airway & Oxygen Therapy: Patient Spontanous Breathing and Patient connected to face mask oxygen  Post-op Assessment: Report given to RN and Post -op Vital signs reviewed and stable  Post vital signs: Reviewed and stable  Last Vitals:  Vitals Value Taken Time  BP    Temp    Pulse 78 12/20/20 0923  Resp 14 12/20/20 0923  SpO2 100 % 12/20/20 0923  Vitals shown include unvalidated device data.  Last Pain:  Vitals:   12/20/20 0624  TempSrc: Oral  PainSc: 0-No pain      Patients Stated Pain Goal: 3 (50/51/83 3582)  Complications: No notable events documented.

## 2020-12-20 NOTE — Interval H&P Note (Signed)
History and Physical Interval Note:  12/20/2020 7:00 AM  Kristie Woods  has presented today for surgery, with the diagnosis of Symptomatic mammary hypertrophy.  The various methods of treatment have been discussed with the patient and family. After consideration of risks, benefits and other options for treatment, the patient has consented to  Procedure(s) with comments: Bilateral breast reduction with possible lipo (Bilateral) - 2.5 hours possible,LIPOSUCTION WITH LIPOFILLING (Bilateral) as a surgical intervention.  The patient's history has been reviewed, patient examined, no change in status, stable for surgery.  I have reviewed the patient's chart and labs.  Questions were answered to the patient's satisfaction.     Loel Lofty Naaman Curro

## 2020-12-20 NOTE — Discharge Instructions (Addendum)
INSTRUCTIONS FOR AFTER SURGERY   You will likely have some questions about what to expect following your operation.  The following information will help you and your family understand what to expect when you are discharged from the hospital.  Following these guidelines will help ensure a smooth recovery and reduce risks of complications.  Postoperative instructions include information on: diet, wound care, medications and physical activity.  AFTER SURGERY Expect to go home after the procedure.  In some cases, you may need to spend one night in the hospital for observation.  DIET This surgery does not require a specific diet.  However, I have to mention that the healthier you eat the better your body can start healing. It is important to increasing your protein intake.  This means limiting the foods with added sugar.  Focus on fruits and vegetables and some meat. It is very important to drink water after your surgery.  If your urine is bright yellow, then it is concentrated, and you need to drink more water.  As a general rule after surgery, you should have 8 ounces of water every hour while awake.  If you find you are persistently nauseated or unable to take in liquids let us know.  NO TOBACCO USE or EXPOSURE.  This will slow your healing process and increase the risk of a wound.  WOUND CARE If you don't have a drain: You can shower the day after surgery.  Use fragrance free soap.  Dial, [place], [place] and Cetaphil are usually mild on the skin.  If you have steri-strips / tape directly attached to your skin leave them in place. It is OK to get these wet.  No baths, pools or hot tubs for two weeks. We close your incision to leave the smallest and best-looking scar. No ointment or creams on your incisions until given the go ahead.  Especially not Neosporin (Too many skin reactions with this one).  A few weeks after surgery you can use Mederma and start massaging the scar. We ask you to wear your binder or  sports bra for the first 6 weeks around the clock, including while sleeping. This provides added comfort and helps reduce the fluid accumulation at the surgery site.  ACTIVITY No heavy lifting until cleared by the doctor.  It is OK to walk and climb stairs. In fact, moving your legs is very important to decrease your risk of a blood clot.  It will also help keep you from getting deconditioned.  Every 1 to 2 hours get up and walk for 5 minutes. This will help with a quicker recovery back to normal.  Let pain be your guide so you don't do too much.  NO, you cannot do the spring cleaning and don't plan on taking care of anyone else.  This is your time for TLC.   WORK Everyone returns to work at different times. As a rough guide, most people take at least 1 - 2 weeks off prior to returning to work. If you need documentation for your job, bring the forms to your postoperative follow up visit.  DRIVING Arrange for someone to bring you home from the hospital.  You may be able to drive a few days after surgery but not while taking any narcotics or valium.  BOWEL MOVEMENTS Constipation can occur after anesthesia and while taking pain medication.  It is important to stay ahead for your comfort.  We recommend taking Milk of Magnesia (2 tablespoons; twice a day) while taking  the pain pills.  SEROMA This is fluid your body tried to put in the surgical site.  This is normal but if it creates excessive pain and swelling let us know.  It usually decreases in a few weeks.  MEDICATIONS and PAIN CONTROL At your preoperative visit for you history and physical you were given the following medications: An antibiotic: Start this medication when you get home and take according to the instructions on the bottle. Zofran 4 mg:  This is to treat nausea and vomiting.  You can take this every 6 hours as needed and only if needed. Norco (hydrocodone/acetaminophen) 5/325 mg:  This is only to be used after you have taken the  motrin or the tylenol. Every 8 hours as needed. Over the counter Medication to take: Ibuprofen (Motrin) 600 mg:  Take this every 6 hours.  If you have additional pain then take 500 mg of the tylenol.  Only take the Norco after you have tried these two. Miralax or stool softener of choice: Take this according to the bottle if you take the [place] Call your surgeon's office if any of the following occur:  Fever 101 degrees F or greater  Excessive bleeding or fluid from the incision site.  Pain that increases over time without aid from the medications  Redness, warmth, or pus draining from incision sites  Persistent nausea or inability to take in liquids  Severe misshapen area that underwent the operation.  Next dose of Tylenol after 12:30pm for pain.   Post Anesthesia Home Care Instructions  Activity: Get plenty of rest for the remainder of the day. A responsible individual must stay with you for 24 hours following the procedure.  For the next 24 hours, DO NOT: -Drive a car -Paediatric nurse -Drink alcoholic beverages -Take any medication unless instructed by your physician -Make any legal decisions or sign important papers.  Meals: Start with liquid foods such as gelatin or soup. Progress to regular foods as tolerated. Avoid greasy, spicy, heavy foods. If nausea and/or vomiting occur, drink only clear liquids until the nausea and/or vomiting subsides. Call your physician if vomiting continues.  Special Instructions/Symptoms: Your throat may feel dry or sore from the anesthesia or the breathing tube placed in your throat during surgery. If this causes discomfort, gargle with warm salt water. The discomfort should disappear within 24 hours.  If you had a scopolamine patch placed behind your ear for the management of post- operative nausea and/or vomiting:  1. The medication in the patch is effective for 72 hours, after which it should be removed.  Wrap patch in a tissue  and discard in the trash. Wash hands thoroughly with soap and water. 2. You may remove the patch earlier than 72 hours if you experience unpleasant side effects which may include dry mouth, dizziness or visual disturbances. 3. Avoid touching the patch. Wash your hands with soap and water after contact with the patch.

## 2020-12-20 NOTE — Op Note (Signed)
Breast Reduction Op note:    DATE OF PROCEDURE: 12/20/2020  LOCATION: [place]  SURGEON: Lyndee Leo Sanger Gabby Rackers, DO  ASSISTANT: Dr. Lennice Sites and Krista Blue, PA  PREOPERATIVE DIAGNOSIS 1. Macromastia 2. Neck Pain 3. Back Pain  POSTOPERATIVE DIAGNOSIS 1. Macromastia 2. Neck Pain 3. Back Pain  PROCEDURES 1. Bilateral breast reduction.  Right reduction 400 g, Left reduction 413 g  COMPLICATIONS: None.  DRAINS: none  INDICATIONS FOR PROCEDURE Kristie Woods is a 40 y.o. year-old female born on [birth year],with a history of symptomatic macromastia with concominant back pain, neck pain, shoulder grooving from her bra.   MRN: [id number]  CONSENT Informed consent was obtained directly from the patient. The risks, benefits and alternatives were fully discussed. Specific risks including but not limited to bleeding, infection, hematoma, seroma, scarring, pain, nipple necrosis, asymmetry, poor cosmetic results, and need for further surgery were discussed. The patient had ample opportunity to have her questions answered to her satisfaction.  DESCRIPTION OF PROCEDURE  Patient was brought into the operating room and placed in a supine position.  SCDs were placed and appropriate padding was performed.  Antibiotics were given. The patient underwent general anesthesia and the chest was prepped and draped in a sterile fashion.  A timeout was performed and all information was confirmed to be correct. Tumescent was placed laterally and liposuction was performed on the lateral breasts bilaterally for contour and symmetry.  Right side: Preoperative markings were confirmed.  Incision lines were injected with local with epinephrine.  After waiting for vasoconstriction, the marked lines were incised.  A Wise-pattern superomedial breast reduction was performed by de-epithelializing the pedicle, using bovie to create the superomedial pedicle, and removing breast tissue  from the superior, lateral, and inferior portions of the breast.  Care was taken to not undermine the breast pedicle. Hemostasis was achieved.  The nipple was gently rotated into position and the soft tissue closed with 4-0 Monocryl.   The pocket was irrigated and hemostasis confirmed.  The deep tissues were approximated with 3-0 PDS and Monocryl sutures and the skin was closed with deep dermal and subcuticular 4-0 Monocryl sutures.  The nipple and skin flaps had good capillary refill at the end of the procedure.    Left side: Preoperative markings were confirmed.  Incision lines were injected with local with epinephrine.  After waiting for vasoconstriction, the marked lines were incised.  A Wise-pattern superomedial breast reduction was performed by de-epithelializing the pedicle, using bovie to create the superomedial pedicle, and removing breast tissue from the superior, lateral, and inferior portions of the breast.  Care was taken to not undermine the breast pedicle. Hemostasis was achieved.  The nipple was gently rotated into position and the soft tissue was closed with 4-0 Monocryl.  The patient was sat upright and size and shape symmetry was confirmed.  The pocket was irrigated and hemostasis confirmed.  The deep tissues were approximated with 3-0 PDS and Monocryl sutures and the skin was closed with deep dermal and subcuticular 4-0 Monocryl sutures.  Dermabond was applied.  A breast binder and ABDs were placed.  The nipple and skin flaps had good capillary refill at the end of the procedure.  The patient tolerated the procedure well. The patient was allowed to wake from anesthesia and taken to the recovery room in satisfactory condition.  The advanced practice practitioner (APP) assisted throughout the case.  The APP was essential in retraction and counter traction when needed to make the case progress  smoothly.  This retraction and assistance made it possible to see the tissue plans for the procedure.   The assistance was needed for blood control, tissue re-approximation and assisted with closure of the incision site.

## 2020-12-20 NOTE — Anesthesia Postprocedure Evaluation (Signed)
Anesthesia Post Note  Patient: Kristie Woods  Procedure(s) Performed: Bilateral breast reduction with liposuction (Bilateral: Breast)     Patient location during evaluation: PACU Anesthesia Type: General Level of consciousness: awake and alert Pain management: pain level controlled Vital Signs Assessment: post-procedure vital signs reviewed and stable Respiratory status: spontaneous breathing, nonlabored ventilation and respiratory function stable Cardiovascular status: stable and blood pressure returned to baseline Anesthetic complications: no   No notable events documented.  Last Vitals:  Vitals:   12/20/20 1000 12/20/20 1033  BP: 115/79 130/74  Pulse: 95 92  Resp: 18 16  Temp:  36.8 C  SpO2: 100% 100%    Last Pain:  Vitals:   12/20/20 1033  TempSrc:   PainSc: [place] Rosibel Giacobbe

## 2020-12-20 NOTE — Anesthesia Procedure Notes (Signed)
Procedure Name: LMA Insertion Date/Time: 12/20/2020 7:37 AM Performed by: Garrel Ridgel, CRNA Pre-anesthesia Checklist: Patient identified, Emergency Drugs available, Suction available and Patient being monitored Patient Re-evaluated:Patient Re-evaluated prior to induction Oxygen Delivery Method: Circle system utilized Preoxygenation: Pre-oxygenation with 100% oxygen Induction Type: IV induction Ventilation: Mask ventilation without difficulty LMA: LMA inserted LMA Size: 4.0 Number of attempts: 1 Placement Confirmation: positive ETCO2 Tube secured with: Tape Dental Injury: Teeth and Oropharynx as per pre-operative assessment

## 2020-12-21 ENCOUNTER — Encounter (HOSPITAL_BASED_OUTPATIENT_CLINIC_OR_DEPARTMENT_OTHER): Payer: Self-pay | Admitting: Plastic Surgery

## 2020-12-21 LAB — SURGICAL PATHOLOGY

## 2020-12-26 ENCOUNTER — Encounter (HOSPITAL_BASED_OUTPATIENT_CLINIC_OR_DEPARTMENT_OTHER): Payer: Self-pay | Admitting: Plastic Surgery

## 2020-12-26 MED ORDER — 0.9 % SODIUM CHLORIDE (POUR BTL) OPTIME
TOPICAL | Status: DC | PRN
  Administered 2020-12-20: 300 mL

## 2020-12-31 NOTE — Progress Notes (Signed)
Patient is a 40 year old female s/p bilateral breast reduction with liposuction performed 12/20/2020 by Dr. Marla Roe presents to clinic for postoperative follow-up.  Approximately 400 g removed from each breast.  She was closed with subcuticular absorbable sutures followed by Dermabond and breast binder/ABDs.  Given tape allergies, no adhesive dressings or Steri-Strips were applied.  Today, patient is doing exceptionally well.  She is only 12 days postop yet she has only required a single Tylenol and a single ibuprofen throughout her entire recovery.  She states that her honeycomb dressings have gotten mildly moist from showering, but otherwise she is doing well.  Patient feels already improved with regard to her neck/back discomfort from breast hypertrophy.  She is pleased with her surgical outcome.  She has mild postoperative soreness in axillary regions bilaterally, but tolerated well with continued TopiFoam secured with compressive binder.  She denies any redness, swelling, pain, fevers or chills, obvious wounds/dehiscence, or drainage.    Physical exam is entirely reassuring.  The honeycomb dressings were removed given that they were moist.  A few of her inferior Steri-Strips were also removed due to being saturated and falling off.  Incisions appear to be healing well, no wounds or dehiscence noted.  No malodor.  No surrounding erythema.  No subcutaneous fluid collections appreciated.  NAC's are viable.  She has made tremendous progress in such a short time.  She already has her next postoperative encounter scheduled.  She knows that if her Steri-Strips fall off in interim, that is fine.  Vaseline only as needed.  She will continue to keep them clean and covered.  Mederma or Skinuva only after her incisions have healed.  We will discuss further at next appointment.

## 2021-01-01 ENCOUNTER — Other Ambulatory Visit: Payer: Self-pay

## 2021-01-01 ENCOUNTER — Ambulatory Visit (INDEPENDENT_AMBULATORY_CARE_PROVIDER_SITE_OTHER): Payer: No Typology Code available for payment source | Admitting: Physician Assistant

## 2021-01-01 DIAGNOSIS — Z9889 Other specified postprocedural states: Secondary | ICD-10-CM

## 2021-01-18 ENCOUNTER — Other Ambulatory Visit: Payer: Self-pay

## 2021-01-18 ENCOUNTER — Ambulatory Visit (INDEPENDENT_AMBULATORY_CARE_PROVIDER_SITE_OTHER): Payer: No Typology Code available for payment source | Admitting: Surgical

## 2021-01-18 DIAGNOSIS — Z9889 Other specified postprocedural states: Secondary | ICD-10-CM

## 2021-01-18 NOTE — Progress Notes (Signed)
Patient is a 40 year old female here for follow-up after bilateral breast reduction with Dr. Marla Roe on 12/20/2020.  She reports overall she is doing really well.  She is not having any issues today.  Chaperone present on exam On exam bilateral NAC's are viable, bilateral breast incisions are intact and healing well.  There is no erythema or cellulitic changes noted.  No subcutaneous fluid collections.  No wounds noted.  Recommend continue her compressive garment and avoid strenuous activity for 2 more weeks.  We discussed starting to use scar cream in 1 week.  Monocryl suture knots were removed from bilateral NAC's.  All of her questions were answered to her content.  Pictures were taken and placed in the patient's chart with patient's permission.  We discussed scheduling an additional follow-up or following up as needed, patient feels comfortable following up as needed.  She is aware to call with any questions or concerns or if she notices any changes and needs to be reevaluated.

## 2021-03-19 ENCOUNTER — Other Ambulatory Visit: Payer: Self-pay

## 2021-03-26 ENCOUNTER — Other Ambulatory Visit: Payer: Self-pay

## 2021-03-26 ENCOUNTER — Ambulatory Visit (INDEPENDENT_AMBULATORY_CARE_PROVIDER_SITE_OTHER): Payer: No Typology Code available for payment source | Admitting: Family Medicine

## 2021-03-26 ENCOUNTER — Encounter: Payer: Self-pay | Admitting: Family Medicine

## 2021-03-26 VITALS — BP 100/80 | HR 108 | Temp 98.4°F | Ht 64.0 in | Wt 147.6 lb

## 2021-03-26 DIAGNOSIS — Z13220 Encounter for screening for lipoid disorders: Secondary | ICD-10-CM | POA: Diagnosis not present

## 2021-03-26 DIAGNOSIS — E663 Overweight: Secondary | ICD-10-CM | POA: Diagnosis not present

## 2021-03-26 DIAGNOSIS — Z130 Encounter for screening for diseases of the blood and blood-forming organs and certain disorders involving the immune mechanism: Secondary | ICD-10-CM | POA: Diagnosis not present

## 2021-03-26 DIAGNOSIS — Z0000 Encounter for general adult medical examination without abnormal findings: Secondary | ICD-10-CM

## 2021-03-26 LAB — CBC
HCT: 36.8 % (ref 36.0–46.0)
Hemoglobin: 12.3 g/dL (ref 12.0–15.0)
MCHC: 33.4 g/dL (ref 30.0–36.0)
MCV: 83.7 fl (ref 78.0–100.0)
Platelets: 249 10*3/uL (ref 150.0–400.0)
RBC: 4.4 Mil/uL (ref 3.87–5.11)
RDW: 11.9 % (ref 11.5–15.5)
WBC: 4.2 10*3/uL (ref 4.0–10.5)

## 2021-03-26 LAB — HEMOGLOBIN A1C: Hgb A1c MFr Bld: 5.3 % (ref 4.6–6.5)

## 2021-03-26 NOTE — Progress Notes (Signed)
Tommi Rumps, MD Phone: [phone]  Kristie Woods is a 41 y.o. female who presents today for CPE.  Diet: generally healthy Exercise: soccer, walks Pap smear: 03/07/20 NILM neg HPV Colonoscopy: not indicated Mammogram: 07/04/20 Family history-  Colon cancer: no  Breast cancer: no  Ovarian cancer: no Menses: 1x/month lasting 10 days, this is a chronic issue, patient reports GYN is aware Vaccines-   Flu: UTD  Tetanus: UTD  COVID19: x2 HIV screening: UTD Hep C Screening: declines Tobacco use: quit last year Alcohol use: in moderation Illicit Drug use: no Dentist: yes Ophthalmology: occasionally   Active Ambulatory Problems    Diagnosis Date Noted   Migraine 09/23/2012   Routine general medical examination at a health care facility 01/17/2014   H/O cesarean section complicating pregnancy 41/32/4401   Post-operative state 02/16/2015   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   Abnormal MRI 02/26/2018   Eustachian tube dysfunction, left 03/19/2020   Back pain 07/10/2020   Symptomatic mammary hypertrophy 07/10/2020   Neck pain 07/10/2020   Resolved Ambulatory Problems    Diagnosis Date Noted   Abdominal pain, lower 09/23/2012   Flank pain, acute 12/06/2012   Past Medical History:  Diagnosis Date   Anemia    GERD (gastroesophageal reflux disease)    H/O colposcopy with cervical biopsy 2009   Hyperlipidemia    Kidney stones 2011    Family History  Problem Relation Age of Onset   Hyperlipidemia Mother    Thyroid disease Mother    Heart disease Father    Stroke Father 11   Hyperlipidemia Father    Crohn's disease Brother    Alcohol abuse Maternal Grandmother    Ulcerative colitis Brother    Breast cancer Neg Hx     Social History   Socioeconomic History   Marital status: Married    Spouse name: Not on file   Number of children: Not on file   Years of education: Not on file   Highest education level: Not on file  Occupational History   Not on file  Tobacco  Use   Smoking status: Former    Packs/day: 0.25    Years: 10.00    Pack years: 2.50    Types: Cigarettes    Quit date: 01/15/2015    Years since quitting: 6.1   Smokeless tobacco: Never  Substance and Sexual Activity   Alcohol use: Yes    Comment: Occassionally   Drug use: No   Sexual activity: Yes  Other Topics Concern   Not on file  Social History Narrative   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, 35month, CComptroller     Work - [employer]     Diet - healthy      Exercise - soccer, wOncologist walks            Social Determinants of Health   Financial Resource Strain: Not on file  Food Insecurity: Not on file  Transportation Needs: Not on file  Physical Activity: Not on file  Stress: Not on file  Social Connections: Not on file  Intimate Partner Violence: Not on file    ROS  General:  Negative for nexplained weight loss, fever Skin: Negative for new or changing mole, sore that won't heal HEENT: Negative for trouble hearing, trouble seeing, ringing in ears, mouth sores, hoarseness, change in voice, dysphagia. CV:  Negative for chest pain, dyspnea, edema, palpitations Resp: Negative for cough, dyspnea, hemoptysis GI: Negative for nausea, vomiting, diarrhea, constipation, abdominal pain,  melena, hematochezia. GU: Negative for dysuria, incontinence, urinary hesitance, hematuria, vaginal or penile discharge, polyuria, sexual difficulty, lumps in testicle or breasts MSK: Negative for muscle cramps or aches, joint pain or swelling Neuro: Negative for headaches, weakness, numbness, dizziness, passing out/fainting Psych: Negative for depression, anxiety, memory problems  Objective  Physical Exam Vitals:   03/26/21 0827  BP: 100/80  Pulse: (!) 108  Temp: 98.4 F (36.9 C)  SpO2: 98%    BP Readings from Last 3 Encounters:  03/26/21 100/80  12/20/20 130/74  11/30/20 121/81   Wt Readings from Last 3 Encounters:  03/26/21 147 lb 9.6 oz (67 kg)  12/20/20  148 lb 2.4 oz (67.2 kg)  11/30/20 149 lb 6.4 oz (67.8 kg)    Physical Exam Constitutional:      General: She is not in acute distress.    Appearance: She is not diaphoretic.  HENT:     Head: Normocephalic and atraumatic.  Eyes:     Conjunctiva/sclera: Conjunctivae normal.     Pupils: Pupils are equal, round, and reactive to light.  Cardiovascular:     Rate and Rhythm: Normal rate and regular rhythm.     Heart sounds: Normal heart sounds.  Pulmonary:     Effort: Pulmonary effort is normal.     Breath sounds: Normal breath sounds.  Abdominal:     General: Bowel sounds are normal. There is no distension.     Palpations: Abdomen is soft.     Tenderness: There is no abdominal tenderness.  Musculoskeletal:     Right lower leg: No edema.     Left lower leg: No edema.  Lymphadenopathy:     Cervical: No cervical adenopathy.  Skin:    General: Skin is warm and dry.  Neurological:     Mental Status: She is alert.  Psychiatric:        Mood and Affect: Mood normal.     Assessment/Plan:   Problem List Items Addressed This Visit     Routine general medical examination at a health care facility - Primary    Physical exam completed.  Encouraged continued healthy diet and exercise.  The patient declines any COVID boosters.  I congratulated her on smoking cessation.  She declines hepatitis C screening as she notes she is not at risk for this.  I encouraged her to periodically see the ophthalmologist and she notes she will this year.  Lab work as outlined.      Other Visit Diagnoses     Overweight       Relevant Orders   HgB A1c   Lipid screening       Relevant Orders   Comp Met (CMET)   Lipid panel   Screening for deficiency anemia       Relevant Orders   CBC       Return in about 1 year (around 03/26/2022) for CPE.  This visit occurred during the SARS-CoV-2 public health emergency.  Safety protocols were in place, including screening questions prior to the visit,  additional usage of staff PPE, and extensive cleaning of exam room while observing appropriate contact time as indicated for disinfecting solutions.    Tommi Rumps, MD [place]

## 2021-03-26 NOTE — Assessment & Plan Note (Signed)
Physical exam completed.  Encouraged continued healthy diet and exercise.  The patient declines any COVID boosters.  I congratulated her on smoking cessation.  She declines hepatitis C screening as she notes she is not at risk for this.  I encouraged her to periodically see the ophthalmologist and she notes she will this year.  Lab work as outlined.

## 2021-03-26 NOTE — Patient Instructions (Signed)
Nice to see you. Please continue with healthy diet and exercise. We will check lab work today.

## 2021-03-27 LAB — LIPID PANEL
Cholesterol: 202 mg/dL — ABNORMAL HIGH (ref 0–200)
HDL: 55.5 mg/dL (ref >39.00)
LDL Cholesterol: 118 mg/dL — ABNORMAL HIGH (ref 0–99)
NonHDL: 146.93
Total CHOL/HDL Ratio: 4
Triglycerides: 145 mg/dL (ref 0.0–149.0)
VLDL: 29 mg/dL (ref 0.0–40.0)

## 2021-03-27 LAB — COMPREHENSIVE METABOLIC PANEL
ALT: 17 U/L (ref 0–35)
AST: 15 U/L (ref 0–37)
Albumin: 4 g/dL (ref 3.5–5.2)
Alkaline Phosphatase: 54 U/L (ref 39–117)
BUN: 19 mg/dL (ref 6–23)
CO2: 28 mEq/L (ref 19–32)
Calcium: 9.4 mg/dL (ref 8.4–10.5)
Chloride: 103 mEq/L (ref 96–112)
Creatinine, Ser: 0.7 mg/dL (ref 0.40–1.20)
GFR: 107.37 mL/min (ref >60.00)
Glucose, Bld: 97 mg/dL (ref 70–99)
Potassium: 4.4 mEq/L (ref 3.5–5.1)
Sodium: 137 mEq/L (ref 135–145)
Total Bilirubin: 1.3 mg/dL — ABNORMAL HIGH (ref 0.2–1.2)
Total Protein: 6.2 g/dL (ref 6.0–8.3)

## 2021-03-29 ENCOUNTER — Encounter: Payer: Self-pay | Admitting: Family Medicine

## 2021-04-12 ENCOUNTER — Other Ambulatory Visit: Payer: Self-pay

## 2021-04-12 ENCOUNTER — Encounter: Payer: Self-pay | Admitting: Family Medicine

## 2021-04-12 ENCOUNTER — Ambulatory Visit (INDEPENDENT_AMBULATORY_CARE_PROVIDER_SITE_OTHER): Payer: No Typology Code available for payment source | Admitting: Family Medicine

## 2021-04-12 VITALS — BP 110/60 | HR 115 | Ht 64.0 in | Wt 145.4 lb

## 2021-04-12 DIAGNOSIS — R009 Unspecified abnormalities of heart beat: Secondary | ICD-10-CM

## 2021-04-12 DIAGNOSIS — R030 Elevated blood-pressure reading, without diagnosis of hypertension: Secondary | ICD-10-CM

## 2021-04-12 DIAGNOSIS — R002 Palpitations: Secondary | ICD-10-CM

## 2021-04-12 DIAGNOSIS — R0602 Shortness of breath: Secondary | ICD-10-CM | POA: Diagnosis not present

## 2021-04-12 NOTE — Assessment & Plan Note (Signed)
Undetermined cause.  She is tachycardic.  Discussed the wide differential.  EKG is generally reassuring with the exception of tachycardia.  We will obtain lab work as outlined below.  Discussed the potential for seeing cardiology if her labs are reassuring.  Discussed if her D-dimer was elevated she would need a CT scan of her chest and over the weekend would likely need to go to the ED for this.  I did discuss that if she happened to see an elevated D-dimer prior to me calling her with the result she could just go to the emergency department for evaluation. ?

## 2021-04-12 NOTE — Progress Notes (Signed)
?Tommi Rumps, MD ?Phone: [phone] ? ?Kristie Woods is a 41 y.o. female who presents today for same-day visit. ? ?Dyspnea/palpitations: Patient notes this has been going on a couple of weeks.  First noticed some dyspnea while playing soccer that was out of the ordinary.  She has noticed her heart rate has been higher resting.  She notes some swelling for 1 to 2 days though that resolved.  She has had no chest pain, cough, or hemoptysis.  No recent travel or surgeries.  No history of blood clots.  No excessive bleeding.  She notes her menstrual cycles actually been lighter than previously. ? ?Social History  ? ?Tobacco Use  ?Smoking Status Former  ? Packs/day: 0.25  ? Years: 10.00  ? Pack years: 2.50  ? Types: Cigarettes  ? Quit date: 01/15/2015  ? Years since quitting: 6.2  ?Smokeless Tobacco Never  ? ? ?No current outpatient medications on file prior to visit.  ? ?No current facility-administered medications on file prior to visit.  ? ? ? ?ROS see history of present illness ? ?Objective ? ?Physical Exam ?Vitals:  ? 04/12/21 1403  ?BP: 110/60  ?Pulse: (!) 115  ?SpO2: 98%  ? ? ?BP Readings from Last 3 Encounters:  ?04/12/21 110/60  ?03/26/21 100/80  ?12/20/20 130/74  ? ?Wt Readings from Last 3 Encounters:  ?04/12/21 145 lb 6.4 oz (66 kg)  ?03/26/21 147 lb 9.6 oz (67 kg)  ?12/20/20 148 lb 2.4 oz (67.2 kg)  ? ? ?Physical Exam ?Constitutional:   ?   General: She is not in acute distress. ?   Appearance: She is not diaphoretic.  ?Cardiovascular:  ?   Rate and Rhythm: Normal rate and regular rhythm.  ?   Heart sounds: Normal heart sounds.  ?Pulmonary:  ?   Effort: Pulmonary effort is normal.  ?   Breath sounds: Normal breath sounds.  ?Skin: ?   General: Skin is warm and dry.  ?Neurological:  ?   Mental Status: She is alert.  ? ? ? ?Assessment/Plan: Please see individual problem list. ? ?Problem List Items Addressed This Visit   ? ? Shortness of breath  ?  Undetermined cause.  She is tachycardic.  Discussed the  wide differential.  EKG is generally reassuring with the exception of tachycardia.  We will obtain lab work as outlined below.  Discussed the potential for seeing cardiology if her labs are reassuring.  Discussed if her D-dimer was elevated she would need a CT scan of her chest and over the weekend would likely need to go to the ED for this.  I did discuss that if she happened to see an elevated D-dimer prior to me calling her with the result she could just go to the emergency department for evaluation. ?  ?  ? Relevant Orders  ? CBC  ? D-Dimer, Quantitative  ? TSH  ? ?Other Visit Diagnoses   ? ? Elevated heart rate with elevated blood pressure without diagnosis of hypertension    -  Primary  ? Relevant Orders  ? EKG 12-Lead (Completed)  ? Palpitations      ? Relevant Orders  ? CBC  ? D-Dimer, Quantitative  ? TSH  ? ?  ? ? ?Return in about 4 weeks (around 05/10/2021). ? ?This visit occurred during the SARS-CoV-2 public health emergency.  Safety protocols were in place, including screening questions prior to the visit, additional usage of staff PPE, and extensive cleaning of exam room while observing appropriate contact  time as indicated for disinfecting solutions.  ? ? ?Tommi Rumps, MD ?[place] ? ?

## 2021-04-12 NOTE — Patient Instructions (Signed)
Nice to see you. ?We will get lab work today. ?If your D-dimer is elevated you will need to go to the hospital for a CT scan. ?

## 2021-04-13 ENCOUNTER — Other Ambulatory Visit: Payer: Self-pay | Admitting: Family Medicine

## 2021-04-13 DIAGNOSIS — R7989 Other specified abnormal findings of blood chemistry: Secondary | ICD-10-CM

## 2021-04-13 LAB — CBC
HCT: 33.5 % — ABNORMAL LOW (ref 35.0–45.0)
Hemoglobin: 11.6 g/dL — ABNORMAL LOW (ref 11.7–15.5)
MCH: 28.1 pg (ref 27.0–33.0)
MCHC: 34.6 g/dL (ref 32.0–36.0)
MCV: 81.1 fL (ref 80.0–100.0)
MPV: 10.5 fL (ref 7.5–12.5)
Platelets: 265 10*3/uL (ref 140–400)
RBC: 4.13 10*6/uL (ref 3.80–5.10)
RDW: 10.8 % — ABNORMAL LOW (ref 11.0–15.0)
WBC: 4.1 10*3/uL (ref 3.8–10.8)

## 2021-04-13 LAB — D-DIMER, QUANTITATIVE: D-Dimer, Quant: 0.38 mcg/mL FEU (ref <0.50)

## 2021-04-13 LAB — TSH: TSH: 0.01 mIU/L — ABNORMAL LOW

## 2021-04-13 MED ORDER — ATENOLOL 25 MG PO TABS
25.0000 mg | ORAL_TABLET | Freq: Every day | ORAL | 3 refills | Status: DC
  Filled 2021-04-13: qty 90, 90d supply, fill #0

## 2021-04-15 ENCOUNTER — Other Ambulatory Visit: Payer: Self-pay

## 2021-04-23 ENCOUNTER — Other Ambulatory Visit: Payer: Self-pay

## 2021-04-23 ENCOUNTER — Other Ambulatory Visit (INDEPENDENT_AMBULATORY_CARE_PROVIDER_SITE_OTHER): Payer: No Typology Code available for payment source

## 2021-04-23 DIAGNOSIS — R7989 Other specified abnormal findings of blood chemistry: Secondary | ICD-10-CM

## 2021-04-23 LAB — T4, FREE: Free T4: 4.74 ng/dL — ABNORMAL HIGH (ref 0.60–1.60)

## 2021-05-01 ENCOUNTER — Encounter: Payer: Self-pay | Admitting: Family Medicine

## 2021-05-01 ENCOUNTER — Other Ambulatory Visit: Payer: Self-pay

## 2021-05-01 ENCOUNTER — Other Ambulatory Visit: Payer: Self-pay | Admitting: Family Medicine

## 2021-05-01 DIAGNOSIS — E0590 Thyrotoxicosis, unspecified without thyrotoxic crisis or storm: Secondary | ICD-10-CM | POA: Insufficient documentation

## 2021-05-01 MED ORDER — ALPRAZOLAM 0.25 MG PO TABS
0.2500 mg | ORAL_TABLET | Freq: Once | ORAL | 0 refills | Status: DC | PRN
  Filled 2021-05-01: qty 10, 10d supply, fill #0

## 2021-05-01 NOTE — Telephone Encounter (Signed)
CMA spoke with patient regarding this earlier today. See result note.  ?

## 2021-05-03 ENCOUNTER — Encounter: Payer: Self-pay | Admitting: Family Medicine

## 2021-05-03 ENCOUNTER — Other Ambulatory Visit: Payer: Self-pay

## 2021-05-03 ENCOUNTER — Telehealth: Payer: Self-pay | Admitting: Family Medicine

## 2021-05-03 ENCOUNTER — Ambulatory Visit (INDEPENDENT_AMBULATORY_CARE_PROVIDER_SITE_OTHER): Payer: No Typology Code available for payment source | Admitting: Family Medicine

## 2021-05-03 DIAGNOSIS — R7989 Other specified abnormal findings of blood chemistry: Secondary | ICD-10-CM

## 2021-05-03 DIAGNOSIS — E0590 Thyrotoxicosis, unspecified without thyrotoxic crisis or storm: Secondary | ICD-10-CM

## 2021-05-03 MED ORDER — ATENOLOL 50 MG PO TABS
50.0000 mg | ORAL_TABLET | Freq: Every day | ORAL | 1 refills | Status: DC
  Filled 2021-05-03: qty 90, 90d supply, fill #0
  Filled 2021-09-03: qty 90, 90d supply, fill #1

## 2021-05-03 NOTE — Telephone Encounter (Signed)
Please let the patient know that I heard back from endocrinology. They recommended additional lab work at her next visit with me to help determine the cause and then we could potentially start another medication depending on the cause. She was supposed to have a 2 week follow-up with me. It is ok to use a same day slot if needed for this and it can be slightly earlier than 2 weeks if needed with the upcoming holiday.  ?

## 2021-05-03 NOTE — Telephone Encounter (Signed)
-----   Message from Philemon Kingdom, MD sent at 05/03/2021  9:43 AM EDT ----- ?Hi Zarai Orsborn, ?The question is whether she has Graves' disease versus thyroiditis.  I will suggest to recheck her TFTs when she returns to see you and add TSI antibodies.  If elevated, we can assume she has Graves' disease and in that case I would start methimazole.  I would probably use 5 mg twice a day to start with. ?Hope this helps. ?Sincerely, ?Philemon Kingdom MD ? ?----- Message ----- ?From: Leone Haven, MD ?Sent: 05/03/2021   9:01 AM EDT ?To: Philemon Kingdom, MD ? ?Hi Cristina,  ? ?I wanted to see if you might be able to provide some guidance for this patient. I saw her a few weeks ago for dyspnea on exertion and elevated heart rate. Her work up indicated hyperthyroidism with a TSH of 0.01 and a free T4 of 4.74. I started her on atenolol for symptom management and referred her to you all. She's had some improvement in her symptoms (tachycardia, DOE). I am increasing her atenolol to see if that will help further. Would it be best to go ahead and start methimazole before she gets in to see you all? If so, is there anything else I need to check before starting that? She's had a recent CBC and CMET. If you have any other guidance it would be much appreciated. Thanks for your help. ? ?[place] Fischel ? ?

## 2021-05-03 NOTE — Progress Notes (Signed)
?Tommi Rumps, MD ?Phone: [phone] ? ?Kristie Woods is a 41 y.o. female who presents today for follow-up. ? ?Hypothyroidism: The patient was diagnosed with hypothyroidism recently.  She has been referred to endocrinology though has not been scheduled for an appointment yet.  She notes her heart rate is up though not quite as high as it was previously.  Notes it does go up quite a bit when she is doing minimal activity.  She noted it got up into the 150s during a casual walk yesterday.  No skin changes.  She does note heat intolerance.  No orthopnea, PND, or edema. ? ?Social History  ? ?Tobacco Use  ?Smoking Status Former  ? Packs/day: 0.25  ? Years: 10.00  ? Pack years: 2.50  ? Types: Cigarettes  ? Quit date: 01/15/2015  ? Years since quitting: 6.3  ?Smokeless Tobacco Never  ? ? ?Current Outpatient Medications on File Prior to Visit  ?Medication Sig Dispense Refill  ? ALPRAZolam (XANAX) 0.25 MG tablet Take 1 tablet (0.25 mg total) by mouth once as needed for up to 1 dose for anxiety (with flying). 10 tablet 0  ? ?No current facility-administered medications on file prior to visit.  ? ? ? ?ROS see history of present illness ? ?Objective ? ?Physical Exam ?Vitals:  ? 05/03/21 0837  ?BP: 110/60  ?Pulse: (!) 103  ?Temp: 98.6 ?F (37 ?C)  ?SpO2: 96%  ? ? ?BP Readings from Last 3 Encounters:  ?05/03/21 110/60  ?04/12/21 110/60  ?03/26/21 100/80  ? ?Wt Readings from Last 3 Encounters:  ?05/03/21 139 lb 12.8 oz (63.4 kg)  ?04/12/21 145 lb 6.4 oz (66 kg)  ?03/26/21 147 lb 9.6 oz (67 kg)  ? ? ?Physical Exam ?Constitutional:   ?   General: She is not in acute distress. ?   Appearance: She is not diaphoretic.  ?Cardiovascular:  ?   Rate and Rhythm: Normal rate and regular rhythm.  ?   Heart sounds: Normal heart sounds.  ?Pulmonary:  ?   Effort: Pulmonary effort is normal.  ?   Breath sounds: Normal breath sounds.  ?Musculoskeletal:  ?   Right lower leg: No edema.  ?   Left lower leg: No edema.  ?Skin: ?   General: Skin  is warm and dry.  ?Neurological:  ?   Mental Status: She is alert.  ? ? ? ?Assessment/Plan: Please see individual problem list. ? ?Problem List Items Addressed This Visit   ? ? Hyperthyroidism  ?  Patient has had mild improvement in symptoms following initiation of atenolol.  She has been referred to endocrinology.  Given persistent symptoms we will increase her atenolol to 50 mg once daily.  She will monitor her blood pressure daily with this increase and if it is consistently less than 100/55 or if she starts to have lightheadedness she will let us know and she will reduce the dose of atenolol back to 25 mg once daily.  We will send a message to one of our endocrinology colleagues to get their input on further management while we await an appointment with them.  Patient will follow-up with me in 2 weeks.  Discussed risk of heart failure with hyperthyroidism.  She was advised to contact us or seek medical attention immediately if she develops any heart failure symptoms or any worsening symptoms. ?  ?  ? Relevant Medications  ? atenolol (TENORMIN) 50 MG tablet  ? ?Other Visit Diagnoses   ? ? Low TSH level      ?  Relevant Medications  ? atenolol (TENORMIN) 50 MG tablet  ? ?  ? ? ?Return in about 2 weeks (around 05/17/2021) for Hyperthyroidism. ? ?This visit occurred during the SARS-CoV-2 public health emergency.  Safety protocols were in place, including screening questions prior to the visit, additional usage of staff PPE, and extensive cleaning of exam room while observing appropriate contact time as indicated for disinfecting solutions.  ? ? ?Tommi Rumps, MD ?[place] ? ?

## 2021-05-03 NOTE — Patient Instructions (Signed)
Nice to see you. ?We are going to increase the atenolol to 50 mg once daily.  Please check your blood pressure once a day with this increase.  If you drop below 100/55 or you start to feel lightheaded intermittently you need to let me know and you can reduce the dose back to 25 mg once daily. ?If you develop trouble breathing while laying down, swelling, worsening dyspnea on exertion, or persistent palpitations you need to be reevaluated. ?

## 2021-05-03 NOTE — Assessment & Plan Note (Signed)
Patient has had mild improvement in symptoms following initiation of atenolol.  She has been referred to endocrinology.  Given persistent symptoms we will increase her atenolol to 50 mg once daily.  She will monitor her blood pressure daily with this increase and if it is consistently less than 100/55 or if she starts to have lightheadedness she will let us know and she will reduce the dose of atenolol back to 25 mg once daily.  We will send a message to one of our endocrinology colleagues to get their input on further management while we await an appointment with them.  Patient will follow-up with me in 2 weeks.  Discussed risk of heart failure with hyperthyroidism.  She was advised to contact us or seek medical attention immediately if she develops any heart failure symptoms or any worsening symptoms. ?

## 2021-05-06 ENCOUNTER — Encounter: Payer: Self-pay | Admitting: Family Medicine

## 2021-05-06 NOTE — Telephone Encounter (Signed)
I called and spoke with the patient and informed her that the provider wants to do additional labs on her at her next visit and she is scheduled.  Verble Styron,cma  ?

## 2021-05-15 ENCOUNTER — Encounter: Payer: Self-pay | Admitting: Family Medicine

## 2021-05-15 ENCOUNTER — Other Ambulatory Visit: Payer: Self-pay | Admitting: Obstetrics and Gynecology

## 2021-05-15 ENCOUNTER — Ambulatory Visit (INDEPENDENT_AMBULATORY_CARE_PROVIDER_SITE_OTHER): Payer: No Typology Code available for payment source | Admitting: Family Medicine

## 2021-05-15 VITALS — BP 110/60 | HR 85 | Temp 98.5°F | Ht 64.0 in | Wt 141.0 lb

## 2021-05-15 DIAGNOSIS — E0590 Thyrotoxicosis, unspecified without thyrotoxic crisis or storm: Secondary | ICD-10-CM | POA: Diagnosis not present

## 2021-05-15 DIAGNOSIS — Z1231 Encounter for screening mammogram for malignant neoplasm of breast: Secondary | ICD-10-CM

## 2021-05-15 LAB — TSH: TSH: 0.01 u[IU]/mL — ABNORMAL LOW (ref 0.35–5.50)

## 2021-05-15 LAB — T4, FREE: Free T4: 4.32 ng/dL — ABNORMAL HIGH (ref 0.60–1.60)

## 2021-05-15 LAB — T3, FREE: T3, Free: 10.1 pg/mL — ABNORMAL HIGH (ref 2.3–4.2)

## 2021-05-15 NOTE — Assessment & Plan Note (Signed)
Patient symptoms have improved quite a bit since increasing the dose of the atenolol.  Endocrinology recommended the work-up as outlined below.  If her TSI is elevated we will start on methimazole at the direction of endocrinology.  If her TSI is normal she likely has thyroiditis and we could potentially increase her atenolol further to see if this would provide additional control of her symptoms.  She will contact endocrinology to see about setting up her appointment.  She is about to go on a trip to [place].  I advised that she would need to see how she is feeling prior to getting on any roller coasters.  If she feels abnormal at all she should avoid those rides. ?

## 2021-05-15 NOTE — Patient Instructions (Signed)
Nice to see you. ?We will contact you with your lab results. ?Please continue with the atenolol. ?Please try to contact endocrinology to see about an appointment. ?

## 2021-05-15 NOTE — Progress Notes (Signed)
?Tommi Rumps, MD ?Phone: [phone] ? ?Kristie Woods is a 41 y.o. female who presents today for follow-up. ? ?Hypothyroidism: Patient reports she has done well on the increased dose of atenolol.  Her breathing has improved.  Her heart rate has come down.  Her blood pressures been normal at home.  Has not had any lightheadedness.  In retrospect today she notes a little bit of a swallowing issue previously that she wonders if it could be related to her thyroid.  No issues with food getting stuck when swallowing. ? ?Social History  ? ?Tobacco Use  ?Smoking Status Former  ? Packs/day: 0.25  ? Years: 10.00  ? Pack years: 2.50  ? Types: Cigarettes  ? Quit date: 01/15/2015  ? Years since quitting: 6.3  ?Smokeless Tobacco Never  ? ? ?Current Outpatient Medications on File Prior to Visit  ?Medication Sig Dispense Refill  ? ALPRAZolam (XANAX) 0.25 MG tablet Take 1 tablet (0.25 mg total) by mouth once as needed for up to 1 dose for anxiety (with flying). 10 tablet 0  ? atenolol (TENORMIN) 50 MG tablet Take 1 tablet (50 mg total) by mouth daily. 90 tablet 1  ? ?No current facility-administered medications on file prior to visit.  ? ? ? ?ROS see history of present illness ? ?Objective ? ?Physical Exam ?Vitals:  ? 05/15/21 1000  ?BP: 110/60  ?Pulse: 85  ?Temp: 98.5 ?F (36.9 ?C)  ?SpO2: 99%  ? ? ?BP Readings from Last 3 Encounters:  ?05/15/21 110/60  ?05/03/21 110/60  ?04/12/21 110/60  ? ?Wt Readings from Last 3 Encounters:  ?05/15/21 141 lb (64 kg)  ?05/03/21 139 lb 12.8 oz (63.4 kg)  ?04/12/21 145 lb 6.4 oz (66 kg)  ? ? ?Physical Exam ?Constitutional:   ?   General: She is not in acute distress. ?   Appearance: She is not diaphoretic.  ?Neck:  ?   Thyroid: No thyroid mass, thyromegaly or thyroid tenderness.  ?Cardiovascular:  ?   Rate and Rhythm: Normal rate and regular rhythm.  ?   Heart sounds: Normal heart sounds.  ?Pulmonary:  ?   Effort: Pulmonary effort is normal.  ?   Breath sounds: Normal breath sounds.   ?Skin: ?   General: Skin is warm and dry.  ?Neurological:  ?   Mental Status: She is alert.  ? ? ? ?Assessment/Plan: Please see individual problem list. ? ?Problem List Items Addressed This Visit   ? ? Hyperthyroidism - Primary  ?  Patient symptoms have improved quite a bit since increasing the dose of the atenolol.  Endocrinology recommended the work-up as outlined below.  If her TSI is elevated we will start on methimazole at the direction of endocrinology.  If her TSI is normal she likely has thyroiditis and we could potentially increase her atenolol further to see if this would provide additional control of her symptoms.  She will contact endocrinology to see about setting up her appointment.  She is about to go on a trip to [place].  I advised that she would need to see how she is feeling prior to getting on any roller coasters.  If she feels abnormal at all she should avoid those rides. ?  ?  ? Relevant Orders  ? TSH  ? T4, free  ? T3, free  ? Thyroid stimulating immunoglobulin  ? ? ? ?Return in about 3 months (around 08/14/2021). ? ?This visit occurred during the SARS-CoV-2 public health emergency.  Safety protocols were in  place, including screening questions prior to the visit, additional usage of staff PPE, and extensive cleaning of exam room while observing appropriate contact time as indicated for disinfecting solutions.  ? ? ?Tommi Rumps, MD ?[place] ? ?

## 2021-05-20 LAB — THYROID STIMULATING IMMUNOGLOBULIN: TSI: 420 % baseline — ABNORMAL HIGH (ref <140)

## 2021-05-22 ENCOUNTER — Other Ambulatory Visit: Payer: Self-pay

## 2021-05-22 ENCOUNTER — Other Ambulatory Visit: Payer: Self-pay | Admitting: Family Medicine

## 2021-05-22 DIAGNOSIS — E0590 Thyrotoxicosis, unspecified without thyrotoxic crisis or storm: Secondary | ICD-10-CM

## 2021-05-22 MED ORDER — METHIMAZOLE 5 MG PO TABS
5.0000 mg | ORAL_TABLET | Freq: Two times a day (BID) | ORAL | 1 refills | Status: DC
  Filled 2021-05-22: qty 60, 30d supply, fill #0
  Filled 2021-06-21: qty 60, 30d supply, fill #1

## 2021-06-05 ENCOUNTER — Encounter: Payer: Self-pay | Admitting: Family Medicine

## 2021-06-05 DIAGNOSIS — E0590 Thyrotoxicosis, unspecified without thyrotoxic crisis or storm: Secondary | ICD-10-CM

## 2021-06-17 ENCOUNTER — Other Ambulatory Visit (INDEPENDENT_AMBULATORY_CARE_PROVIDER_SITE_OTHER): Payer: No Typology Code available for payment source

## 2021-06-17 DIAGNOSIS — E0590 Thyrotoxicosis, unspecified without thyrotoxic crisis or storm: Secondary | ICD-10-CM

## 2021-06-18 LAB — T4, FREE: Free T4: 1.15 ng/dL (ref 0.60–1.60)

## 2021-06-18 LAB — TSH: TSH: <0.01 u[IU]/mL — ABNORMAL LOW (ref 0.35–5.50)

## 2021-06-18 LAB — T3, FREE: T3, Free: 3.3 pg/mL (ref 2.3–4.2)

## 2021-06-20 LAB — THYROID STIMULATING IMMUNOGLOBULIN: TSI: 425 % baseline — ABNORMAL HIGH (ref <140)

## 2021-06-21 ENCOUNTER — Other Ambulatory Visit: Payer: Self-pay

## 2021-07-04 ENCOUNTER — Ambulatory Visit
Admission: RE | Admit: 2021-07-04 | Discharge: 2021-07-04 | Disposition: A | Discharge status: Home / Self Care | Payer: No Typology Code available for payment source | Source: Ambulatory Visit | Attending: Obstetrics and Gynecology | Admitting: Obstetrics and Gynecology

## 2021-07-04 DIAGNOSIS — Z1231 Encounter for screening mammogram for malignant neoplasm of breast: Secondary | ICD-10-CM | POA: Insufficient documentation

## 2021-07-24 ENCOUNTER — Other Ambulatory Visit: Payer: Self-pay

## 2021-07-24 ENCOUNTER — Other Ambulatory Visit: Payer: Self-pay | Admitting: Family Medicine

## 2021-07-24 DIAGNOSIS — E0590 Thyrotoxicosis, unspecified without thyrotoxic crisis or storm: Secondary | ICD-10-CM

## 2021-07-24 MED ORDER — METHIMAZOLE 5 MG PO TABS
5.0000 mg | ORAL_TABLET | Freq: Two times a day (BID) | ORAL | 1 refills | Status: DC
  Filled 2021-07-24: qty 60, 30d supply, fill #0
  Filled 2021-09-20: qty 60, 30d supply, fill #1

## 2021-07-24 NOTE — Telephone Encounter (Signed)
Please confirm that this refill is for flying. Did she take all of the prescription I sent in in March?

## 2021-07-25 ENCOUNTER — Other Ambulatory Visit: Payer: Self-pay

## 2021-07-25 MED ORDER — ALPRAZOLAM 0.25 MG PO TABS
0.2500 mg | ORAL_TABLET | Freq: Once | ORAL | 0 refills | Status: DC | PRN
  Filled 2021-07-25: qty 10, 10d supply, fill #0

## 2021-08-19 ENCOUNTER — Encounter: Payer: Self-pay | Admitting: Family Medicine

## 2021-08-19 ENCOUNTER — Ambulatory Visit (INDEPENDENT_AMBULATORY_CARE_PROVIDER_SITE_OTHER): Payer: No Typology Code available for payment source | Admitting: Family Medicine

## 2021-08-19 DIAGNOSIS — E0590 Thyrotoxicosis, unspecified without thyrotoxic crisis or storm: Secondary | ICD-10-CM | POA: Diagnosis not present

## 2021-08-19 NOTE — Progress Notes (Signed)
  Tommi Rumps, MD Phone: [phone]  Kristie Woods is a 41 y.o. female who presents today for follow-up.  Hypothyroidism: Patient finally saw endocrinology.  They ended up reducing her methimazole to 1 tablet daily.  She continues on atenolol.  She follows up with endocrinology in August for recheck of lab work.  She notes her heart rate has remained stable with the reduced dose of methimazole.  She notes no shortness of breath or fatigue.  She notes symptomatically she is significantly improved compared to her initial visit.  Social History   Tobacco Use  Smoking Status Former   Packs/day: 0.25   Years: 10.00   Total pack years: 2.50   Types: Cigarettes   Quit date: 01/15/2015   Years since quitting: 6.5  Smokeless Tobacco Never    Current Outpatient Medications on File Prior to Visit  Medication Sig Dispense Refill   ALPRAZolam (XANAX) 0.25 MG tablet Take 1 tablet (0.25 mg total) by mouth once as needed for up to 1 dose for anxiety (with flying). 10 tablet 0   atenolol (TENORMIN) 50 MG tablet Take 1 tablet (50 mg total) by mouth daily. 90 tablet 1   Cholecalciferol 25 MCG (1000 UT) capsule Take by mouth.     methimazole (TAPAZOLE) 5 MG tablet Take 5 mg by mouth daily.     No current facility-administered medications on file prior to visit.     ROS see history of present illness  Objective  Physical Exam Vitals:   08/19/21 0828  BP: 90/60  Pulse: 77  Temp: 98.4 F (36.9 C)  SpO2: 96%    BP Readings from Last 3 Encounters:  08/19/21 90/60  05/15/21 110/60  05/03/21 110/60   Wt Readings from Last 3 Encounters:  08/19/21 137 lb 9.6 oz (62.4 kg)  05/15/21 141 lb (64 kg)  05/03/21 139 lb 12.8 oz (63.4 kg)    Physical Exam Constitutional:      General: She is not in acute distress.    Appearance: She is not diaphoretic.  Cardiovascular:     Rate and Rhythm: Normal rate and regular rhythm.     Heart sounds: Normal heart sounds.  Pulmonary:      Effort: Pulmonary effort is normal.     Breath sounds: Normal breath sounds.  Skin:    General: Skin is warm and dry.  Neurological:     Mental Status: She is alert.      Assessment/Plan: Please see individual problem list.  Problem List Items Addressed This Visit     Hyperthyroidism    Significant improvement in symptoms.  She will continue methimazole 5 mg daily per endocrinology.  She will continue atenolol 50 mg daily.  She will follow-up with me next at her physical in February.  She will continue to follow with endocrinology for this issue.      Relevant Medications   methimazole (TAPAZOLE) 5 MG tablet    Return for as scheduled.   Tommi Rumps, MD [place]

## 2021-08-19 NOTE — Assessment & Plan Note (Signed)
Significant improvement in symptoms.  She will continue methimazole 5 mg daily per endocrinology.  She will continue atenolol 50 mg daily.  She will follow-up with me next at her physical in February.  She will continue to follow with endocrinology for this issue.

## 2021-08-21 ENCOUNTER — Encounter: Payer: Self-pay | Admitting: Family Medicine

## 2021-09-03 ENCOUNTER — Other Ambulatory Visit: Payer: Self-pay

## 2021-09-20 ENCOUNTER — Other Ambulatory Visit: Payer: Self-pay

## 2021-10-08 ENCOUNTER — Other Ambulatory Visit: Payer: Self-pay

## 2021-10-08 MED ORDER — ATENOLOL 50 MG PO TABS
50.0000 mg | ORAL_TABLET | Freq: Every day | ORAL | 1 refills | Status: DC
  Filled 2021-10-08: qty 90, 90d supply, fill #0

## 2021-10-08 MED ORDER — METHIMAZOLE 5 MG PO TABS
ORAL_TABLET | ORAL | 3 refills | Status: DC
  Filled 2021-10-08: qty 60, 30d supply, fill #0
  Filled 2021-11-27: qty 60, 30d supply, fill #1
  Filled 2022-01-20: qty 60, 30d supply, fill #2
  Filled 2022-04-17: qty 60, 30d supply, fill #3

## 2021-10-10 ENCOUNTER — Other Ambulatory Visit: Payer: Self-pay

## 2021-10-15 ENCOUNTER — Other Ambulatory Visit: Payer: Self-pay

## 2021-11-27 ENCOUNTER — Other Ambulatory Visit: Payer: Self-pay

## 2022-01-20 ENCOUNTER — Other Ambulatory Visit: Payer: Self-pay

## 2022-02-11 ENCOUNTER — Other Ambulatory Visit: Payer: Self-pay

## 2022-02-11 DIAGNOSIS — E0500 Thyrotoxicosis with diffuse goiter without thyrotoxic crisis or storm: Secondary | ICD-10-CM | POA: Diagnosis not present

## 2022-02-11 MED ORDER — METHIMAZOLE 5 MG PO TABS
2.5000 mg | ORAL_TABLET | Freq: Every day | ORAL | 1 refills | Status: DC
  Filled 2022-02-11: qty 45, 90d supply, fill #0

## 2022-02-20 ENCOUNTER — Other Ambulatory Visit: Payer: Self-pay

## 2022-02-20 MED ORDER — AZITHROMYCIN 250 MG PO TABS
ORAL_TABLET | ORAL | 0 refills | Status: AC
  Filled 2022-02-20: qty 6, 5d supply, fill #0

## 2022-03-28 ENCOUNTER — Encounter: Payer: No Typology Code available for payment source | Admitting: Family Medicine

## 2022-04-07 DIAGNOSIS — Z01411 Encounter for gynecological examination (general) (routine) with abnormal findings: Secondary | ICD-10-CM | POA: Diagnosis not present

## 2022-04-07 DIAGNOSIS — N920 Excessive and frequent menstruation with regular cycle: Secondary | ICD-10-CM | POA: Diagnosis not present

## 2022-04-15 DIAGNOSIS — E0500 Thyrotoxicosis with diffuse goiter without thyrotoxic crisis or storm: Secondary | ICD-10-CM | POA: Diagnosis not present

## 2022-04-15 DIAGNOSIS — N920 Excessive and frequent menstruation with regular cycle: Secondary | ICD-10-CM | POA: Diagnosis not present

## 2022-04-22 ENCOUNTER — Other Ambulatory Visit: Payer: Self-pay | Admitting: Obstetrics and Gynecology

## 2022-04-22 DIAGNOSIS — E0500 Thyrotoxicosis with diffuse goiter without thyrotoxic crisis or storm: Secondary | ICD-10-CM | POA: Diagnosis not present

## 2022-04-22 DIAGNOSIS — D500 Iron deficiency anemia secondary to blood loss (chronic): Secondary | ICD-10-CM

## 2022-04-22 NOTE — Progress Notes (Unsigned)
Iron deficient anemia . Will order  IV Venofer 300 mg weekly for 3 doses

## 2022-04-23 ENCOUNTER — Other Ambulatory Visit: Payer: Self-pay

## 2022-04-23 ENCOUNTER — Encounter: Payer: Self-pay | Admitting: Family Medicine

## 2022-04-23 ENCOUNTER — Ambulatory Visit (INDEPENDENT_AMBULATORY_CARE_PROVIDER_SITE_OTHER): Payer: 59 | Admitting: Family Medicine

## 2022-04-23 VITALS — BP 122/76 | HR 75 | Temp 99.2°F | Ht 64.0 in | Wt 156.3 lb

## 2022-04-23 DIAGNOSIS — F40243 Fear of flying: Secondary | ICD-10-CM | POA: Diagnosis not present

## 2022-04-23 DIAGNOSIS — D500 Iron deficiency anemia secondary to blood loss (chronic): Secondary | ICD-10-CM | POA: Diagnosis not present

## 2022-04-23 DIAGNOSIS — R200 Anesthesia of skin: Secondary | ICD-10-CM | POA: Diagnosis not present

## 2022-04-23 DIAGNOSIS — Z0001 Encounter for general adult medical examination with abnormal findings: Secondary | ICD-10-CM | POA: Diagnosis not present

## 2022-04-23 DIAGNOSIS — N920 Excessive and frequent menstruation with regular cycle: Secondary | ICD-10-CM | POA: Diagnosis not present

## 2022-04-23 DIAGNOSIS — D509 Iron deficiency anemia, unspecified: Secondary | ICD-10-CM | POA: Insufficient documentation

## 2022-04-23 DIAGNOSIS — Z13220 Encounter for screening for lipoid disorders: Secondary | ICD-10-CM

## 2022-04-23 LAB — COMPREHENSIVE METABOLIC PANEL
ALT: 12 U/L (ref 0–35)
AST: 16 U/L (ref 0–37)
Albumin: 4.2 g/dL (ref 3.5–5.2)
Alkaline Phosphatase: 49 U/L (ref 39–117)
BUN: 18 mg/dL (ref 6–23)
CO2: 27 mEq/L (ref 19–32)
Calcium: 9.3 mg/dL (ref 8.4–10.5)
Chloride: 103 mEq/L (ref 96–112)
Creatinine, Ser: 0.86 mg/dL (ref 0.40–1.20)
GFR: 83.24 mL/min (ref >60.00)
Glucose, Bld: 79 mg/dL (ref 70–99)
Potassium: 3.7 mEq/L (ref 3.5–5.1)
Sodium: 136 mEq/L (ref 135–145)
Total Bilirubin: 1.6 mg/dL — ABNORMAL HIGH (ref 0.2–1.2)
Total Protein: 6.7 g/dL (ref 6.0–8.3)

## 2022-04-23 LAB — LIPID PANEL
Cholesterol: 215 mg/dL — ABNORMAL HIGH (ref 0–200)
HDL: 74.8 mg/dL (ref >39.00)
LDL Cholesterol: 126 mg/dL — ABNORMAL HIGH (ref 0–99)
NonHDL: 140.55
Total CHOL/HDL Ratio: 3
Triglycerides: 72 mg/dL (ref 0.0–149.0)
VLDL: 14.4 mg/dL (ref 0.0–40.0)

## 2022-04-23 MED ORDER — ALPRAZOLAM 0.25 MG PO TABS
0.2500 mg | ORAL_TABLET | Freq: Once | ORAL | 0 refills | Status: DC | PRN
  Filled 2022-04-23 – 2022-05-14 (×2): qty 10, 10d supply, fill #0

## 2022-04-23 NOTE — Patient Instructions (Signed)
Nice to see you. Please try calorie counting to help determine what a good amount of calories for you would be. You could try the carpal tunnel braces to see if that may help with the hand numbness at night. It may be beneficial to proceed with the iron infusion as it may help with your iron deficiency anemia quicker.

## 2022-04-23 NOTE — Assessment & Plan Note (Signed)
This is likely related to positional nerve compression.  Discussed less likely related to carpal tunnel syndrome.  Discussed using carpal tunnel braces at night to see if that may help prevent her hands from becoming numb.

## 2022-04-23 NOTE — Assessment & Plan Note (Signed)
Patient will continue to follow with her gynecologist regarding this.  They have workup planned.  This is likely the cause of her iron deficiency.

## 2022-04-23 NOTE — Assessment & Plan Note (Signed)
Physical exam completed.  I encouraged continued healthy diet and exercise.  Discussed calorie counting to see if she is eating more calories than she thinks.  She declines any COVID boosters.  She reports prior hepatitis C screening.  I encouraged her to continue to periodically see her ophthalmologist.  Lab work as outlined.

## 2022-04-23 NOTE — Assessment & Plan Note (Signed)
Xanax will be refilled.  Controlled substance database reviewed.  Patient is flying to [place] soon.

## 2022-04-23 NOTE — Progress Notes (Signed)
Kristie Rumps, MD Phone: [phone]  Kristie Woods is a 42 y.o. female who presents today for CPE.  Diet: generally healthy, eats lean meats, lots of salads, not much soda, has cut out junk food Exercise: gym 3x/week, soccer 1-2x/week Pap smear: followed by GYN Mammogram: due in May Family history-  Colon cancer: no  Breast cancer: no  Ovarian cancer: no Menses: heavy lasting 10 days, is seeing GYN for this Vaccines-   Flu: UTD  Tetanus: UTD  COVID19: x2 HIV screening: UTD Hep C Screening: UTD Tobacco use: former smoker Alcohol use: [id number] Illicit Drug use: no Dentist: yes Ophthalmology: periodically  Iron deficiency: Patient has her gynecologist recently diagnosed her with this.  She is been having very heavy menstrual cycles.  They plan to do a sonohysterogram to evaluate further.  The gynecologist wants to have her complete iron infusions.  She wonders if that is appropriate.  Numbness in hands: Patient notes this typically occurs when she is asleep or if she sits in a certain position.  If she changes position this resolves.  She has had issues with this in the past when she was pregnant and they thought it was carpal tunnel syndrome.  Graves' disease: Patient is about to come off of methimazole at the advice of her endocrinologist.   Active Ambulatory Problems    Diagnosis Date Noted   Migraine 09/23/2012   Encounter for general adult medical examination with abnormal findings 01/17/2014   H/O cesarean section complicating pregnancy 99991111   Post-operative state 02/16/2015   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   Abnormal MRI 02/26/2018   Eustachian tube dysfunction, left 03/19/2020   Back pain 07/10/2020   Symptomatic mammary hypertrophy 07/10/2020   Neck pain 07/10/2020   Shortness of breath 04/12/2021   Hyperthyroidism 05/01/2021   Supervision of normal pregnancy 07/19/2014   GBS (group B streptococcus) infection 01/26/2015   Menorrhagia 04/23/2022    Iron deficiency anemia 04/23/2022   Bilateral hand numbness 04/23/2022   Resolved Ambulatory Problems    Diagnosis Date Noted   Abdominal pain, lower 09/23/2012   Flank pain, acute 12/06/2012   Past Medical History:  Diagnosis Date   Anemia    GERD (gastroesophageal reflux disease)    H/O colposcopy with cervical biopsy 2009   Hyperlipidemia    Kidney stones 2011    Family History  Problem Relation Age of Onset   Hyperlipidemia Mother    Thyroid disease Mother    Heart disease Father    Stroke Father 58   Hyperlipidemia Father    Crohn's disease Brother    Alcohol abuse Maternal Grandmother    Ulcerative colitis Brother    Breast cancer Neg Hx     Social History   Socioeconomic History   Marital status: Married    Spouse name: Not on file   Number of children: Not on file   Years of education: Not on file   Highest education level: Not on file  Occupational History   Not on file  Tobacco Use   Smoking status: Former    Packs/day: 0.25    Years: 10.00    Total pack years: 2.50    Types: Cigarettes    Quit date: 01/15/2015    Years since quitting: 7.2   Smokeless tobacco: Never  Substance and Sexual Activity   Alcohol use: Yes    Comment: Occassionally   Drug use: No   Sexual activity: Yes  Other Topics Concern   Not on file  Social  History Narrative   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, 72month, CComptroller     Work - [employer]- healthy      Exercise - soccer, [place] [employer] [place], walks            Social Determinants of Health   Financial Resource Strain: Not on file  Food Insecurity: Not on file  Transportation Needs: Not on file  Physical Activity: Not on file  Stress: Not on file  Social Connections: Not on file  Intimate Partner Violence: Not on file    ROS  General:  Negative for nexplained weight loss, fever Skin: Negative for new or changing mole, sore that won't heal HEENT: Negative for trouble hearing, trouble seeing,  ringing in ears, mouth sores, hoarseness, change in voice, dysphagia. CV:  Negative for chest pain, dyspnea, edema, palpitations Resp: Negative for cough, dyspnea, hemoptysis GI: Negative for nausea, vomiting, diarrhea, constipation, abdominal pain, melena, hematochezia. GU: Negative for dysuria, incontinence, urinary hesitance, hematuria, vaginal or penile discharge, polyuria, sexual difficulty, lumps in testicle or breasts MSK: Positive for muscle cramps or aches, negative for joint pain or swelling Neuro: Positive for numbness, negative for headaches, weakness, dizziness, passing out/fainting Psych: Negative for depression, anxiety, memory problems  Objective  Physical Exam Vitals:   04/23/22 1118  BP: 122/76  Pulse: 75  Temp: 99.2 F (37.3 C)  SpO2: 99%    BP Readings from Last 3 Encounters:  04/23/22 122/76  08/19/21 90/60  05/15/21 110/60   Wt Readings from Last 3 Encounters:  04/23/22 156 lb 4.8 oz (70.9 kg)  08/19/21 137 lb 9.6 oz (62.4 kg)  05/15/21 141 lb (64 kg)    Physical Exam Constitutional:      General: She is not in acute distress.    Appearance: She is not diaphoretic.  HENT:     Head: Normocephalic and atraumatic.  Cardiovascular:     Rate and Rhythm: Normal rate and regular rhythm.     Heart sounds: Normal heart sounds.  Pulmonary:     Effort: Pulmonary effort is normal.     Breath sounds: Normal breath sounds.  Abdominal:     General: Bowel sounds are normal. There is no distension.     Palpations: Abdomen is soft.     Tenderness: There is no abdominal tenderness.  Musculoskeletal:     Right lower leg: No edema.     Left lower leg: No edema.     Comments: Negative Tinel's and Phalen's bilaterally at wrists  Lymphadenopathy:     Cervical: No cervical adenopathy.  Skin:    General: Skin is warm and dry.  Neurological:     Mental Status: She is alert.     Comments: 5/5 strength in bilateral biceps, triceps, grip, quads, hamstrings, plantar  and dorsiflexion, sensation to light touch intact in bilateral UE and LE, normal gait  Psychiatric:        Mood and Affect: Mood normal.      Assessment/Plan:   Encounter for general adult medical examination with abnormal findings Assessment & Plan: Physical exam completed.  I encouraged continued healthy diet and exercise.  Discussed calorie counting to see if she is eating more calories than she thinks.  She declines any COVID boosters.  She reports prior hepatitis C screening.  I encouraged her to continue to periodically see her ophthalmologist.  Lab work as outlined.   Menorrhagia with regular cycle Assessment & Plan: Patient will continue to follow  with her gynecologist regarding this.  They have workup planned.  This is likely the cause of her iron deficiency.   Iron deficiency anemia due to chronic blood loss Assessment & Plan: Likely resulting from menorrhagia.  Discussed that the iron infusions will likely work quicker than oral iron given her level of her ferritin. Discussed it would be up to the patient if she would like to proceed with the iron infusions or oral iron. If she tries oral iron she will need to take this with orange juice or vitamin C to help with absorption.  She will plan to have this rechecked by gynecology.   Bilateral hand numbness Assessment & Plan: This is likely related to positional nerve compression.  Discussed less likely related to carpal tunnel syndrome.  Discussed using carpal tunnel braces at night to see if that may help prevent her hands from becoming numb.   Fear of flying Assessment & Plan: Xanax will be refilled.  Controlled substance database reviewed.  Patient is flying to [place] soon.  Orders: -     ALPRAZolam; Take 1 tablet (0.25 mg total) by mouth once as needed for up to 1 dose for anxiety (with flying).  Dispense: 10 tablet; Refill: 0  Lipid screening -     Comprehensive metabolic panel -     Lipid panel    Return in about  1 year (around 04/23/2023) for physical.   Kristie Rumps, MD [place]

## 2022-04-23 NOTE — Assessment & Plan Note (Addendum)
Likely resulting from menorrhagia.  Discussed that the iron infusions will likely work quicker than oral iron given her level of her ferritin. Discussed it would be up to the patient if she would like to proceed with the iron infusions or oral iron. If she tries oral iron she will need to take this with orange juice or vitamin C to help with absorption.  She will plan to have this rechecked by gynecology.

## 2022-04-28 ENCOUNTER — Ambulatory Visit: Payer: 59

## 2022-05-02 ENCOUNTER — Encounter: Payer: Self-pay | Admitting: Family Medicine

## 2022-05-02 ENCOUNTER — Telehealth: Payer: Self-pay | Admitting: *Deleted

## 2022-05-02 ENCOUNTER — Ambulatory Visit
Admission: RE | Admit: 2022-05-02 | Discharge: 2022-05-02 | Disposition: A | Discharge status: Home / Self Care | Payer: 59 | Source: Ambulatory Visit | Attending: Family Medicine | Admitting: Family Medicine

## 2022-05-02 DIAGNOSIS — K7689 Other specified diseases of liver: Secondary | ICD-10-CM | POA: Diagnosis not present

## 2022-05-02 DIAGNOSIS — R1011 Right upper quadrant pain: Secondary | ICD-10-CM | POA: Diagnosis not present

## 2022-05-02 DIAGNOSIS — R109 Unspecified abdominal pain: Secondary | ICD-10-CM | POA: Diagnosis not present

## 2022-05-02 NOTE — Telephone Encounter (Signed)
Ordered

## 2022-05-02 NOTE — Telephone Encounter (Signed)
Pt called in regarding previous message. She's having abd pain, nauseas, and would like to know if Dr. Caryl Bis can order an ultrasound to be done @[facility] ?

## 2022-05-02 NOTE — Telephone Encounter (Signed)
Please place future orders for lab appt.  

## 2022-05-02 NOTE — Telephone Encounter (Signed)
Pt called in regarding previous message. Transferred to [place].

## 2022-05-06 ENCOUNTER — Other Ambulatory Visit: Payer: Self-pay

## 2022-05-08 ENCOUNTER — Other Ambulatory Visit (INDEPENDENT_AMBULATORY_CARE_PROVIDER_SITE_OTHER): Payer: 59

## 2022-05-08 LAB — HEPATIC FUNCTION PANEL
ALT: 10 U/L (ref 0–35)
AST: 15 U/L (ref 0–37)
Albumin: 4.3 g/dL (ref 3.5–5.2)
Alkaline Phosphatase: 55 U/L (ref 39–117)
Bilirubin, Direct: 0.2 mg/dL (ref 0.0–0.3)
Total Bilirubin: 1.7 mg/dL — ABNORMAL HIGH (ref 0.2–1.2)
Total Protein: 6.6 g/dL (ref 6.0–8.3)

## 2022-05-09 ENCOUNTER — Other Ambulatory Visit: Payer: Self-pay

## 2022-05-12 ENCOUNTER — Other Ambulatory Visit: Payer: Self-pay

## 2022-05-12 ENCOUNTER — Ambulatory Visit (INDEPENDENT_AMBULATORY_CARE_PROVIDER_SITE_OTHER): Payer: 59 | Admitting: Family Medicine

## 2022-05-12 ENCOUNTER — Encounter: Payer: Self-pay | Admitting: Family Medicine

## 2022-05-12 DIAGNOSIS — R1011 Right upper quadrant pain: Secondary | ICD-10-CM

## 2022-05-12 LAB — CBC
HCT: 37.4 % (ref 36.0–46.0)
Hemoglobin: 12.5 g/dL (ref 12.0–15.0)
MCHC: 33.4 g/dL (ref 30.0–36.0)
MCV: 88.8 fl (ref 78.0–100.0)
Platelets: 225 10*3/uL (ref 150.0–400.0)
RBC: 4.21 Mil/uL (ref 3.87–5.11)
RDW: 13.1 % (ref 11.5–15.5)
WBC: 5.8 10*3/uL (ref 4.0–10.5)

## 2022-05-12 MED ORDER — ONDANSETRON HCL 4 MG PO TABS
4.0000 mg | ORAL_TABLET | Freq: Three times a day (TID) | ORAL | 0 refills | Status: DC | PRN
  Filled 2022-05-12: qty 20, 7d supply, fill #0

## 2022-05-12 NOTE — Progress Notes (Signed)
Tommi Rumps, MD Phone: [phone]  Kristie Woods is a 42 y.o. female who presents today for follow-up.  Elevated indirect bilirubin/right upper quadrant/epigastric pain: Patient notes right after our last visit she developed some abdominal discomfort and had some nausea.  She had some loose stools at some point though that is not terribly uncommon for her with her thyroid issues and medications.  She noted no vomiting.  She had a right upper quadrant ultrasound that was negative for cause of her symptoms.  She notes the abdominal pain has resolved.  She did eat dinner last night and had a little nausea afterwards.  She has been off of her methimazole a couple of weeks.  She is now only taking vitamin D.  Social History   Tobacco Use  Smoking Status Former   Packs/day: 0.25   Years: 10.00   Additional pack years: 0.00   Total pack years: 2.50   Types: Cigarettes   Quit date: 01/15/2015   Years since quitting: 7.3  Smokeless Tobacco Never    Current Outpatient Medications on File Prior to Visit  Medication Sig Dispense Refill   ALPRAZolam (XANAX) 0.25 MG tablet Take 1 tablet (0.25 mg total) by mouth once as needed for up to 1 dose for anxiety (with flying). 10 tablet 0   Cholecalciferol 25 MCG (1000 UT) capsule Take by mouth.     atenolol (TENORMIN) 50 MG tablet Take 1 tablet (50 mg total) by mouth once daily (Patient not taking: Reported on 05/12/2022) 90 tablet 1   methimazole (TAPAZOLE) 5 MG tablet Take 2 tablets (10 mg total) by mouth once daily (Patient not taking: Reported on 05/12/2022) 60 tablet 3   No current facility-administered medications on file prior to visit.     ROS see history of present illness  Objective  Physical Exam Vitals:   05/12/22 1024  BP: 114/74  Pulse: (!) 52  Temp: 98 F (36.7 C)  SpO2: 99%    BP Readings from Last 3 Encounters:  05/12/22 114/74  04/23/22 122/76  08/19/21 90/60   Wt Readings from Last 3 Encounters:  05/12/22 152  lb 3.2 oz (69 kg)  04/23/22 156 lb 4.8 oz (70.9 kg)  08/19/21 137 lb 9.6 oz (62.4 kg)    Physical Exam Constitutional:      General: She is not in acute distress.    Appearance: She is not diaphoretic.  Pulmonary:     Effort: Pulmonary effort is normal.  Abdominal:     General: Bowel sounds are normal. There is no distension.     Palpations: Abdomen is soft.     Tenderness: There is no abdominal tenderness.  Neurological:     Mental Status: She is alert.      Assessment/Plan: Please see individual problem list.  Hyperbilirubinemia Assessment & Plan: Noted on recent labs.  Was persistent on recheck.  Discussed potential causes of hemolysis and benign liver conditions regarding the processing of bilirubin.  Ultrasound was reassuring.  We will get lab work today to evaluate for underlying hemolysis.  Orders: -     CBC -     Haptoglobin -     Reticulocytes  Right upper quadrant abdominal pain Assessment & Plan: Pain has resolved.  She still has some occasional nausea.  Discussed it is possible that she had a viral gastroenteritis that resulted in her symptoms.  Advised if she has recurrent pain she should let us know and we can order a HIDA scan.  I will  send in some Zofran for her to have on hand in case she continues to have some nausea.  Discussed nausea after viral gastroenteritis could intermittently occur for some time.  Orders: -     Ondansetron HCl; Take 1 tablet (4 mg total) by mouth every 8 (eight) hours as needed for nausea or vomiting.  Dispense: 20 tablet; Refill: 0    Return if symptoms worsen or fail to improve.   Tommi Rumps, MD [place]

## 2022-05-12 NOTE — Assessment & Plan Note (Signed)
Pain has resolved.  She still has some occasional nausea.  Discussed it is possible that she had a viral gastroenteritis that resulted in her symptoms.  Advised if she has recurrent pain she should let us know and we can order a HIDA scan.  I will send in some Zofran for her to have on hand in case she continues to have some nausea.  Discussed nausea after viral gastroenteritis could intermittently occur for some time.

## 2022-05-12 NOTE — Assessment & Plan Note (Signed)
Noted on recent labs.  Was persistent on recheck.  Discussed potential causes of hemolysis and benign liver conditions regarding the processing of bilirubin.  Ultrasound was reassuring.  We will get lab work today to evaluate for underlying hemolysis.

## 2022-05-13 ENCOUNTER — Other Ambulatory Visit: Payer: Self-pay

## 2022-05-13 DIAGNOSIS — R790 Abnormal level of blood mineral: Secondary | ICD-10-CM | POA: Diagnosis not present

## 2022-05-13 DIAGNOSIS — N920 Excessive and frequent menstruation with regular cycle: Secondary | ICD-10-CM | POA: Diagnosis not present

## 2022-05-13 LAB — HAPTOGLOBIN: Haptoglobin: 96 mg/dL (ref 43–212)

## 2022-05-13 LAB — RETICULOCYTES
ABS Retic: 61800 cells/uL (ref 20000–80000)
Retic Ct Pct: 1.5 %

## 2022-05-13 MED ORDER — TRANEXAMIC ACID 650 MG PO TABS
1300.0000 mg | ORAL_TABLET | Freq: Three times a day (TID) | ORAL | 3 refills | Status: DC
  Filled 2022-05-13: qty 30, 5d supply, fill #0
  Filled 2022-06-12: qty 30, 5d supply, fill #1

## 2022-05-14 ENCOUNTER — Other Ambulatory Visit: Payer: Self-pay

## 2022-05-16 ENCOUNTER — Other Ambulatory Visit: Payer: Self-pay | Admitting: Obstetrics and Gynecology

## 2022-05-16 DIAGNOSIS — Z1231 Encounter for screening mammogram for malignant neoplasm of breast: Secondary | ICD-10-CM

## 2022-05-20 ENCOUNTER — Encounter: Payer: Self-pay | Admitting: *Deleted

## 2022-06-12 ENCOUNTER — Other Ambulatory Visit: Payer: Self-pay

## 2022-07-08 ENCOUNTER — Ambulatory Visit
Admission: RE | Admit: 2022-07-08 | Discharge: 2022-07-08 | Disposition: A | Discharge status: Home / Self Care | Payer: 59 | Source: Ambulatory Visit | Attending: Obstetrics and Gynecology | Admitting: Obstetrics and Gynecology

## 2022-07-08 DIAGNOSIS — Z1231 Encounter for screening mammogram for malignant neoplasm of breast: Secondary | ICD-10-CM | POA: Diagnosis not present

## 2022-07-09 DIAGNOSIS — N920 Excessive and frequent menstruation with regular cycle: Secondary | ICD-10-CM | POA: Diagnosis not present

## 2022-07-21 ENCOUNTER — Other Ambulatory Visit: Payer: Self-pay

## 2022-07-21 MED ORDER — ONDANSETRON HCL 8 MG PO TABS
8.0000 mg | ORAL_TABLET | Freq: Three times a day (TID) | ORAL | 0 refills | Status: DC | PRN
  Filled 2022-07-21: qty 12, 4d supply, fill #0

## 2022-07-21 MED ORDER — GABAPENTIN 300 MG PO CAPS
300.0000 mg | ORAL_CAPSULE | Freq: Every evening | ORAL | 0 refills | Status: DC
  Filled 2022-07-21: qty 10, 10d supply, fill #0

## 2022-07-21 MED ORDER — TRAMADOL HCL 50 MG PO TABS
50.0000 mg | ORAL_TABLET | Freq: Four times a day (QID) | ORAL | 0 refills | Status: DC | PRN
  Filled 2022-07-21: qty 25, 7d supply, fill #0

## 2022-07-21 MED ORDER — IBUPROFEN 800 MG PO TABS
800.0000 mg | ORAL_TABLET | Freq: Three times a day (TID) | ORAL | 1 refills | Status: DC | PRN
  Filled 2022-07-21: qty 30, 10d supply, fill #0

## 2022-07-21 NOTE — H&P (Signed)
Kristie Woods is a 42 y.o. female here for LAVH and bilateral salpingectomy    Pt here for follow up for Menorrhagia  Tried Lysteda  for the past 2 months and bleeding hasn't changed much .  Past Medical History:  has a past medical history of Anemia, Anxiety, Graves' disease, and Migraines.  Past Surgical History:  has a past surgical history that includes Appendectomy;  nissenfundiplication; LTCS (02/19/2015); Cesarean section (2013 2017); ORIF finger / thumb fracture (Left); and Reduction mammaplasty (12/20/2020). Family History: family history includes Cancer in her maternal grandfather; High blood pressure (Hypertension) in her father and mother; Migraines in her mother; Myocardial Infarction (Heart attack) in her father; Stroke in her father; Thyroid disease in her mother. Social History:  reports that she has quit smoking. Her smoking use included cigarettes. She has never used smokeless tobacco. She reports current alcohol use of about 4.0 standard drinks of alcohol per week. She reports that she does not use drugs. OB/GYN History:  OB History       Gravida  2   Para  2   Term  2   Preterm      AB      Living  2        SAB      IAB      Ectopic      Molar      Multiple      Live Births  2             Allergies: is allergic to macrobid [nitrofurantoin monohyd/m-cryst] and adhesive. Medications:  Current Medications    Current Outpatient Medications:    ALPRAZolam (XANAX) 0.25 MG tablet, Take by mouth, Disp: , Rfl:    cholecalciferol (VITAMIN D3) 1000 unit capsule, Take 1,000 Units by mouth once daily, Disp: , Rfl:    methIMAzole (TAPAZOLE) 5 MG tablet, Take 0.5 tablets (2.5 mg total) by mouth once daily, Disp: 45 tablet, Rfl: 1   tranexamic acid (LYSTEDA) 650 mg tablet, Take 2 tablets (1,300 mg total) by mouth 3 (three) times daily Take for a maximum of 5 days during monthly menstruation., Disp: 30 tablet, Rfl: 3     Review of Systems: General:                       No fatigue or weight loss Eyes:                           No vision changes Ears:                            No hearing difficulty Respiratory:                No cough or shortness of breath Pulmonary:                  No asthma or shortness of breath Cardiovascular:           No chest pain, palpitations, dyspnea on exertion Gastrointestinal:          No abdominal bloating, chronic diarrhea, constipations, masses, pain or hematochezia Genitourinary:             No hematuria, dysuria, abnormal vaginal discharge, pelvic pain, Menometrorrhagia Lymphatic:                   No swollen lymph nodes Musculoskeletal:No muscle weakness Neurologic:  No extremity weakness, syncope, seizure disorder Psychiatric:                  No history of depression, delusions or suicidal/homicidal ideation      Exam:       Vitals:    07/09/22 1500  BP: 120/78  Pulse: 106      Body mass index is 27.16 kg/m.   WDWN white/ female in NAD   Lungs: CTA  CV : RRR without murmur   Breast: exam done in sitting and lying position : No dimpling or retraction, no dominant mass, no spontaneous discharge, no axillary adenopathy Neck:  no thyromegaly Abdomen: soft , no mass, normal active bowel sounds,  non-tender, no rebound tenderness Pelvic: tanner stage 5 ,  External genitalia: vulva /labia no lesions Urethra: no prolapse Vagina: normal physiologic d/c Cervix:  stenotic  + /1 tortuous cavity no lesions, no cervical motion tenderness   Uterus: normal size shape and contour, non-tender adequate room for LAVH  Adnexa: no mass,  non-tender   Rectovaginal:    Impression:    The encounter diagnosis was Menorrhagia with regular cycle.  failed conservative Tx      Plan:   LAVH and bilateral salpingectomy  Possibility of organ injury , blood transfusion , infection , converting to an open procedure all discussed  Risks of the procedure discussed with pt. See [facility] notes              Vilma Prader, MD

## 2022-07-22 ENCOUNTER — Encounter
Admission: RE | Admit: 2022-07-22 | Discharge: 2022-07-22 | Disposition: A | Discharge status: Home / Self Care | Payer: 59 | Source: Ambulatory Visit | Attending: Obstetrics and Gynecology | Admitting: Obstetrics and Gynecology

## 2022-07-22 DIAGNOSIS — Z01812 Encounter for preprocedural laboratory examination: Secondary | ICD-10-CM | POA: Diagnosis not present

## 2022-07-22 DIAGNOSIS — Z01818 Encounter for other preprocedural examination: Secondary | ICD-10-CM

## 2022-07-22 HISTORY — DX: Family history of other specified conditions: Z84.89

## 2022-07-22 HISTORY — DX: Thyrotoxicosis, unspecified without thyrotoxic crisis or storm: E05.90

## 2022-07-22 HISTORY — DX: Excessive and frequent menstruation with regular cycle: N92.0

## 2022-07-22 LAB — CBC
HCT: 37.7 % (ref 36.0–46.0)
Hemoglobin: 12.4 g/dL (ref 12.0–15.0)
MCH: 28.7 pg (ref 26.0–34.0)
MCHC: 32.9 g/dL (ref 30.0–36.0)
MCV: 87.3 fL (ref 80.0–100.0)
Platelets: 273 10*3/uL (ref 150–400)
RBC: 4.32 MIL/uL (ref 3.87–5.11)
RDW: 11.8 % (ref 11.5–15.5)
WBC: 7.8 10*3/uL (ref 4.0–10.5)
nRBC: 0 % (ref 0.0–0.2)

## 2022-07-22 LAB — BASIC METABOLIC PANEL
Anion gap: 10 (ref 5–15)
BUN: 25 mg/dL — ABNORMAL HIGH (ref 6–20)
CO2: 25 mmol/L (ref 22–32)
Calcium: 9.2 mg/dL (ref 8.9–10.3)
Chloride: 96 mmol/L — ABNORMAL LOW (ref 98–111)
Creatinine, Ser: 0.88 mg/dL (ref 0.44–1.00)
GFR, Estimated: >60 mL/min (ref >60)
Glucose, Bld: 84 mg/dL (ref 70–99)
Potassium: 3.4 mmol/L — ABNORMAL LOW (ref 3.5–5.1)
Sodium: 131 mmol/L — ABNORMAL LOW (ref 135–145)

## 2022-07-22 LAB — TYPE AND SCREEN
ABO/RH(D): A NEG
Antibody Screen: NEGATIVE

## 2022-07-22 NOTE — Patient Instructions (Addendum)
Your procedure is scheduled on: Tuesday, June 18 Report to the Registration Desk on the 1st floor of the [facility] To find out your arrival time, please call [phone] between 1PM - 3PM on: Monday, June 17 If your arrival time is 6:00 am, do not arrive before that time as the [facility] entrance doors do not open until 6:00 am.  REMEMBER: Instructions that are not followed completely may result in serious medical risk, up to and including death; or upon the discretion of your surgeon and anesthesiologist your surgery may need to be rescheduled.  Do not eat food after midnight the night before surgery.  No gum chewing or hard candies.  You may however, drink CLEAR liquids up to 2 hours before you are scheduled to arrive for your surgery. Do not drink anything within 2 hours of your scheduled arrival time.  Clear liquids include: - water  - apple juice without pulp - gatorade (not RED colors) - black coffee or tea (Do NOT add milk or creamers to the coffee or tea) Do NOT drink anything that is not on this list.  In addition, your doctor has ordered for you to drink the provided:  Ensure Pre-Surgery Clear Carbohydrate Drink  Drinking this carbohydrate drink up to two hours before surgery helps to reduce insulin resistance and improve patient outcomes. Please complete drinking 2 hours before scheduled arrival time.  One week prior to surgery: starting June 11 Stop Anti-inflammatories (NSAIDS) such as Advil, Aleve, Ibuprofen, Motrin, Naproxen, Naprosyn and Aspirin based products such as Excedrin, Goody's Powder, BC Powder. Stop ANY OVER THE COUNTER supplements until after surgery. You may however, continue to take Tylenol if needed for pain up until the day of surgery.  Continue taking all prescribed medications  DO NOT TAKE ANY MEDICATIONS THE MORNING OF SURGERY   No Alcohol for 24 hours before or after surgery.  No Smoking including e-cigarettes for 24 hours before surgery.   No chewable tobacco products for at least 6 hours before surgery.  No nicotine patches on the day of surgery.  Do not use any "recreational" drugs for at least a week (preferably 2 weeks) before your surgery.  Please be advised that the combination of cocaine and anesthesia may have negative outcomes, up to and including death. If you test positive for cocaine, your surgery will be cancelled.  On the morning of surgery brush your teeth with toothpaste and water, you may rinse your mouth with mouthwash if you wish. Do not swallow any toothpaste or mouthwash.  Use CHG Soap as directed on instruction sheet.  Do not wear jewelry, make-up, hairpins, clips or nail polish.  Do not wear lotions, powders, or perfumes.   Do not shave body hair from the neck down 48 hours before surgery.  Contact lenses, hearing aids and dentures may not be worn into surgery.  Do not bring valuables to the hospital. [facility] is not responsible for any missing/lost belongings or valuables.   Notify your doctor if there is any change in your medical condition (cold, fever, infection).  Wear comfortable clothing (specific to your surgery type) to the hospital.  After surgery, you can help prevent lung complications by doing breathing exercises.  Take deep breaths and cough every 1-2 hours. Your doctor may order a device called an Incentive Spirometer to help you take deep breaths. When coughing or sneezing, hold a pillow firmly against your incision with both hands. This is called "splinting." Doing this helps protect your  incision. It also decreases belly discomfort.  If you are being discharged the day of surgery, you will not be allowed to drive home. You will need a responsible individual to drive you home and stay with you for 24 hours after surgery.   If you are taking public transportation, you will need to have a responsible individual with you.  Please call the Pre-admissions Testing Dept. at [phone] if you have any questions about these instructions.  Surgery Visitation Policy:  Patients having surgery or a procedure may have two visitors.  Children under the age of 68 must have an adult with them who is not the patient.     Preparing for Surgery with CHLORHEXIDINE GLUCONATE (CHG) Soap  Chlorhexidine Gluconate (CHG) Soap  o An antiseptic cleaner that kills germs and bonds with the skin to continue killing germs even after washing  o Used for showering the night before surgery and morning of surgery  Before surgery, you can play an important role by reducing the number of germs on your skin.  CHG (Chlorhexidine gluconate) soap is an antiseptic cleanser which kills germs and bonds with the skin to continue killing germs even after washing.  Please do not use if you have an allergy to CHG or antibacterial soaps. If your skin becomes reddened/irritated stop using the CHG.  1. Shower the NIGHT BEFORE SURGERY and the MORNING OF SURGERY with CHG soap.  2. If you choose to wash your hair, wash your hair first as usual with your normal shampoo.  3. After shampooing, rinse your hair and body thoroughly to remove the shampoo.  4. Use CHG as you would any other liquid soap. You can apply CHG directly to the skin and wash gently with a scrungie or a clean washcloth.  5. Apply the CHG soap to your body only from the neck down. Do not use on open wounds or open sores. Avoid contact with your eyes, ears, mouth, and genitals (private parts). Wash face and genitals (private parts) with your normal soap.  6. Wash thoroughly, paying special attention to the area where your surgery will be performed.  7. Thoroughly rinse your body with warm water.  8. Do not shower/wash with your normal soap after using and rinsing off the CHG soap.  9. Pat yourself dry with a clean towel.  10. Wear clean pajamas to bed the night before surgery.  12. Place clean sheets on your bed the night of  your first shower and do not sleep with pets.  13. Shower again with the CHG soap on the day of surgery prior to arriving at the hospital.  14. Do not apply any deodorants/lotions/powders.  15. Please wear clean clothes to the hospital.

## 2022-07-28 MED ORDER — FAMOTIDINE 20 MG PO TABS
20.0000 mg | ORAL_TABLET | Freq: Once | ORAL | Status: AC
  Administered 2022-07-29: 20 mg via ORAL

## 2022-07-28 MED ORDER — CEFAZOLIN SODIUM-DEXTROSE 2-4 GM/100ML-% IV SOLN
2.0000 g | Freq: Once | INTRAVENOUS | Status: AC
  Administered 2022-07-29: 2 g via INTRAVENOUS

## 2022-07-28 MED ORDER — GABAPENTIN 300 MG PO CAPS
300.0000 mg | ORAL_CAPSULE | ORAL | Status: AC
  Administered 2022-07-29: 300 mg via ORAL

## 2022-07-28 MED ORDER — ORAL CARE MOUTH RINSE: 15.0000 mL | Freq: Once | OROMUCOSAL | Status: AC

## 2022-07-28 MED ORDER — LACTATED RINGERS IV SOLN: INTRAVENOUS | Status: DC

## 2022-07-28 MED ORDER — ACETAMINOPHEN 500 MG PO TABS
1000.0000 mg | ORAL_TABLET | ORAL | Status: AC
  Administered 2022-07-29: 1000 mg via ORAL

## 2022-07-28 MED ORDER — POVIDONE-IODINE 10 % EX SWAB: 2.0000 | Freq: Once | CUTANEOUS | Status: DC

## 2022-07-28 MED ORDER — CHLORHEXIDINE GLUCONATE 0.12 % MT SOLN
15.0000 mL | Freq: Once | OROMUCOSAL | Status: AC
  Administered 2022-07-29: 15 mL via OROMUCOSAL

## 2022-07-29 ENCOUNTER — Other Ambulatory Visit: Payer: Self-pay

## 2022-07-29 ENCOUNTER — Encounter: Payer: Self-pay | Admitting: Obstetrics and Gynecology

## 2022-07-29 ENCOUNTER — Encounter
Admission: RE | Disposition: A | Discharge status: Home / Self Care | Payer: Self-pay | Source: Home / Self Care | Attending: Obstetrics and Gynecology

## 2022-07-29 ENCOUNTER — Ambulatory Visit: Payer: 59 | Admitting: Urgent Care

## 2022-07-29 ENCOUNTER — Ambulatory Visit: Payer: 59

## 2022-07-29 ENCOUNTER — Ambulatory Visit
Admission: RE | Admit: 2022-07-29 | Discharge: 2022-07-29 | Disposition: A | Discharge status: Home / Self Care | Payer: 59 | Attending: Obstetrics and Gynecology | Admitting: Obstetrics and Gynecology

## 2022-07-29 DIAGNOSIS — Z98891 History of uterine scar from previous surgery: Secondary | ICD-10-CM | POA: Diagnosis not present

## 2022-07-29 DIAGNOSIS — F419 Anxiety disorder, unspecified: Secondary | ICD-10-CM | POA: Diagnosis not present

## 2022-07-29 DIAGNOSIS — N838 Other noninflammatory disorders of ovary, fallopian tube and broad ligament: Secondary | ICD-10-CM | POA: Diagnosis not present

## 2022-07-29 DIAGNOSIS — E0500 Thyrotoxicosis with diffuse goiter without thyrotoxic crisis or storm: Secondary | ICD-10-CM | POA: Diagnosis not present

## 2022-07-29 DIAGNOSIS — Z87891 Personal history of nicotine dependence: Secondary | ICD-10-CM | POA: Diagnosis not present

## 2022-07-29 DIAGNOSIS — Z01818 Encounter for other preprocedural examination: Secondary | ICD-10-CM

## 2022-07-29 DIAGNOSIS — N920 Excessive and frequent menstruation with regular cycle: Secondary | ICD-10-CM | POA: Insufficient documentation

## 2022-07-29 HISTORY — PX: CYSTOSCOPY: SHX5120

## 2022-07-29 HISTORY — PX: LAPAROSCOPIC VAGINAL HYSTERECTOMY WITH SALPINGECTOMY: SHX6680

## 2022-07-29 LAB — POCT PREGNANCY, URINE: Preg Test, Ur: NEGATIVE

## 2022-07-29 SURGERY — HYSTERECTOMY, VAGINAL, LAPAROSCOPY-ASSISTED, WITH SALPINGECTOMY
Anesthesia: General | Site: Bladder | Laterality: Bilateral

## 2022-07-29 MED ORDER — BUPIVACAINE HCL 0.5 % IJ SOLN
INTRAMUSCULAR | Status: DC | PRN
  Administered 2022-07-29: 11 mL

## 2022-07-29 MED ORDER — HYDROMORPHONE HCL 1 MG/ML IJ SOLN
INTRAMUSCULAR | Status: AC
  Filled 2022-07-29: qty 1

## 2022-07-29 MED ORDER — SODIUM CHLORIDE 0.9 % IR SOLN
Status: DC | PRN
  Administered 2022-07-29: 300 mL

## 2022-07-29 MED ORDER — KETOROLAC TROMETHAMINE 30 MG/ML IJ SOLN
30.0000 mg | Freq: Once | INTRAMUSCULAR | Status: AC
  Administered 2022-07-29: 30 mg via INTRAVENOUS

## 2022-07-29 MED ORDER — MIDAZOLAM HCL 2 MG/2ML IJ SOLN
INTRAMUSCULAR | Status: DC | PRN
  Administered 2022-07-29: 2 mg via INTRAVENOUS

## 2022-07-29 MED ORDER — FLUORESCEIN SODIUM 10 % IV SOLN
INTRAVENOUS | Status: DC | PRN
  Administered 2022-07-29: .25 mL via INTRAVENOUS

## 2022-07-29 MED ORDER — LIDOCAINE HCL (CARDIAC) PF 100 MG/5ML IV SOSY
PREFILLED_SYRINGE | INTRAVENOUS | Status: DC | PRN
  Administered 2022-07-29: 60 mg via INTRAVENOUS

## 2022-07-29 MED ORDER — OXYCODONE HCL 5 MG PO TABS: 5.0000 mg | ORAL_TABLET | ORAL | Status: DC | PRN

## 2022-07-29 MED ORDER — HYDROMORPHONE HCL 1 MG/ML IJ SOLN
INTRAMUSCULAR | Status: DC | PRN
  Administered 2022-07-29: .25 mg via INTRAVENOUS
  Administered 2022-07-29: .5 mg via INTRAVENOUS
  Administered 2022-07-29: .25 mg via INTRAVENOUS

## 2022-07-29 MED ORDER — ACETAMINOPHEN 500 MG PO TABS
ORAL_TABLET | ORAL | Status: AC
  Filled 2022-07-29: qty 2

## 2022-07-29 MED ORDER — ROCURONIUM BROMIDE 10 MG/ML (PF) SYRINGE
PREFILLED_SYRINGE | INTRAVENOUS | Status: AC
  Filled 2022-07-29: qty 10

## 2022-07-29 MED ORDER — PROPOFOL 1000 MG/100ML IV EMUL
INTRAVENOUS | Status: AC
  Filled 2022-07-29: qty 100

## 2022-07-29 MED ORDER — SULFAMETHOXAZOLE-TRIMETHOPRIM 800-160 MG PO TABS
1.0000 | ORAL_TABLET | Freq: Every day | ORAL | 0 refills | Status: DC
  Filled 2022-07-29: qty 10, 10d supply, fill #0

## 2022-07-29 MED ORDER — OXYCODONE HCL 5 MG/5ML PO SOLN: 5.0000 mg | Freq: Once | ORAL | Status: DC | PRN

## 2022-07-29 MED ORDER — METHYLENE BLUE 1 % INJ SOLN
INTRAVENOUS | Status: DC | PRN
  Administered 2022-07-29: 2 mL

## 2022-07-29 MED ORDER — FENTANYL CITRATE (PF) 100 MCG/2ML IJ SOLN
INTRAMUSCULAR | Status: AC
  Filled 2022-07-29: qty 2

## 2022-07-29 MED ORDER — GABAPENTIN 300 MG PO CAPS
ORAL_CAPSULE | ORAL | Status: AC
  Filled 2022-07-29: qty 1

## 2022-07-29 MED ORDER — ONDANSETRON HCL 4 MG/2ML IJ SOLN
INTRAMUSCULAR | Status: AC
  Filled 2022-07-29: qty 2

## 2022-07-29 MED ORDER — FENTANYL CITRATE (PF) 100 MCG/2ML IJ SOLN: 25.0000 ug | INTRAMUSCULAR | Status: DC | PRN

## 2022-07-29 MED ORDER — ONDANSETRON HCL 4 MG/2ML IJ SOLN
4.0000 mg | Freq: Once | INTRAMUSCULAR | Status: AC | PRN
  Administered 2022-07-29: 4 mg via INTRAVENOUS

## 2022-07-29 MED ORDER — ROCURONIUM BROMIDE 100 MG/10ML IV SOLN
INTRAVENOUS | Status: DC | PRN
  Administered 2022-07-29: 20 mg via INTRAVENOUS
  Administered 2022-07-29: 50 mg via INTRAVENOUS
  Administered 2022-07-29: 20 mg via INTRAVENOUS
  Administered 2022-07-29 (×3): 10 mg via INTRAVENOUS
  Administered 2022-07-29: 20 mg via INTRAVENOUS

## 2022-07-29 MED ORDER — ONDANSETRON 4 MG PO TBDP: 4.0000 mg | ORAL_TABLET | Freq: Four times a day (QID) | ORAL | Status: DC | PRN

## 2022-07-29 MED ORDER — METHYLENE BLUE 1 % INJ SOLN
INTRAVENOUS | Status: AC
  Filled 2022-07-29: qty 10

## 2022-07-29 MED ORDER — LIDOCAINE HCL (PF) 2 % IJ SOLN
INTRAMUSCULAR | Status: AC
  Filled 2022-07-29: qty 5

## 2022-07-29 MED ORDER — FLUORESCEIN SODIUM 10 % IV SOLN
INTRAVENOUS | Status: AC
  Filled 2022-07-29: qty 5

## 2022-07-29 MED ORDER — PROPOFOL 10 MG/ML IV BOLUS
INTRAVENOUS | Status: DC | PRN
  Administered 2022-07-29: 30 mg via INTRAVENOUS
  Administered 2022-07-29: 140 ug/kg/min via INTRAVENOUS
  Administered 2022-07-29: 125 ug/kg/min via INTRAVENOUS
  Administered 2022-07-29: 120 mg via INTRAVENOUS

## 2022-07-29 MED ORDER — KETOROLAC TROMETHAMINE 30 MG/ML IJ SOLN
INTRAMUSCULAR | Status: AC
  Filled 2022-07-29: qty 1

## 2022-07-29 MED ORDER — ONDANSETRON HCL 4 MG/2ML IJ SOLN
INTRAMUSCULAR | Status: DC | PRN
  Administered 2022-07-29: 4 mg via INTRAVENOUS

## 2022-07-29 MED ORDER — MIDAZOLAM HCL 2 MG/2ML IJ SOLN
INTRAMUSCULAR | Status: AC
  Filled 2022-07-29: qty 2

## 2022-07-29 MED ORDER — 0.9 % SODIUM CHLORIDE (POUR BTL) OPTIME
TOPICAL | Status: DC | PRN
  Administered 2022-07-29: 1000 mL

## 2022-07-29 MED ORDER — DEXAMETHASONE SODIUM PHOSPHATE 10 MG/ML IJ SOLN
INTRAMUSCULAR | Status: DC | PRN
  Administered 2022-07-29: 10 mg via INTRAVENOUS

## 2022-07-29 MED ORDER — PROPOFOL 1000 MG/100ML IV EMUL
INTRAVENOUS | Status: AC
  Filled 2022-07-29: qty 200

## 2022-07-29 MED ORDER — LIDOCAINE-EPINEPHRINE 1 %-1:100000 IJ SOLN
INTRAMUSCULAR | Status: AC
  Filled 2022-07-29: qty 1

## 2022-07-29 MED ORDER — LIDOCAINE-EPINEPHRINE 1 %-1:100000 IJ SOLN
INTRAMUSCULAR | Status: DC | PRN
  Administered 2022-07-29: 10 mL

## 2022-07-29 MED ORDER — PROPOFOL 10 MG/ML IV BOLUS
INTRAVENOUS | Status: AC
  Filled 2022-07-29: qty 20

## 2022-07-29 MED ORDER — HEMOSTATIC AGENTS (NO CHARGE) OPTIME
TOPICAL | Status: DC | PRN
  Administered 2022-07-29: 1 via TOPICAL

## 2022-07-29 MED ORDER — SUGAMMADEX SODIUM 200 MG/2ML IV SOLN
INTRAVENOUS | Status: DC | PRN
  Administered 2022-07-29: 200 mg via INTRAVENOUS

## 2022-07-29 MED ORDER — CEFAZOLIN SODIUM-DEXTROSE 2-4 GM/100ML-% IV SOLN
INTRAVENOUS | Status: AC
  Filled 2022-07-29: qty 100

## 2022-07-29 MED ORDER — DEXAMETHASONE SODIUM PHOSPHATE 10 MG/ML IJ SOLN
INTRAMUSCULAR | Status: AC
  Filled 2022-07-29: qty 1

## 2022-07-29 MED ORDER — BUPIVACAINE HCL (PF) 0.5 % IJ SOLN
INTRAMUSCULAR | Status: AC
  Filled 2022-07-29: qty 30

## 2022-07-29 MED ORDER — FENTANYL CITRATE (PF) 100 MCG/2ML IJ SOLN
INTRAMUSCULAR | Status: DC | PRN
  Administered 2022-07-29 (×2): 50 ug via INTRAVENOUS

## 2022-07-29 MED ORDER — CHLORHEXIDINE GLUCONATE 0.12 % MT SOLN
OROMUCOSAL | Status: AC
  Filled 2022-07-29: qty 15

## 2022-07-29 MED ORDER — FAMOTIDINE 20 MG PO TABS
ORAL_TABLET | ORAL | Status: AC
  Filled 2022-07-29: qty 1

## 2022-07-29 MED ORDER — DEXMEDETOMIDINE HCL IN NACL 80 MCG/20ML IV SOLN
INTRAVENOUS | Status: DC | PRN
  Administered 2022-07-29 (×2): 8 ug via INTRAVENOUS
  Administered 2022-07-29: 4 ug via INTRAVENOUS

## 2022-07-29 MED ORDER — OXYCODONE HCL 5 MG PO TABS: 5.0000 mg | ORAL_TABLET | Freq: Once | ORAL | Status: DC | PRN

## 2022-07-29 SURGICAL SUPPLY — 66 items
APL PRP STRL LF DISP 70% ISPRP (MISCELLANEOUS) ×2
APL SRG 38 LTWT LNG FL B (MISCELLANEOUS) ×2
APPLICATOR ARISTA FLEXITIP XL (MISCELLANEOUS) IMPLANT
BAG DRN RND TRDRP ANRFLXCHMBR (UROLOGICAL SUPPLIES) ×2
BAG URINE DRAIN 2000ML AR STRL (UROLOGICAL SUPPLIES) ×2 IMPLANT
BLADE SURG SZ11 CARB STEEL (BLADE) ×2 IMPLANT
CATH FOLEY 2WAY 5CC 16FR (CATHETERS) ×2
CATH ROBINSON RED A/P 16FR (CATHETERS) ×2 IMPLANT
CATH URTH 16FR FL 2W BLN LF (CATHETERS) ×2 IMPLANT
CHLORAPREP W/TINT 26 (MISCELLANEOUS) ×2 IMPLANT
COVER LIGHT HANDLE STERIS (MISCELLANEOUS) IMPLANT
DRAPE SURG 17X11 SM STRL (DRAPES) ×2 IMPLANT
DRSG TEGADERM 2-3/8X2-3/4 SM (GAUZE/BANDAGES/DRESSINGS) ×8 IMPLANT
ELECT REM PT RETURN 9FT ADLT (ELECTROSURGICAL) ×2
ELECTRODE REM PT RTRN 9FT ADLT (ELECTROSURGICAL) ×2 IMPLANT
GAUZE 4X4 16PLY ~~LOC~~+RFID DBL (SPONGE) ×6 IMPLANT
GAUZE SPONGE 2X2 STRL 8-PLY (GAUZE/BANDAGES/DRESSINGS) ×6 IMPLANT
GLOVE SURG SYN 8.0 (GLOVE) ×6 IMPLANT
GLOVE SURG SYN 8.0 PF PI (GLOVE) ×6 IMPLANT
GOWN STRL REUS W/ TWL LRG LVL3 (GOWN DISPOSABLE) ×4 IMPLANT
GOWN STRL REUS W/ TWL XL LVL3 (GOWN DISPOSABLE) ×6 IMPLANT
GOWN STRL REUS W/TWL LRG LVL3 (GOWN DISPOSABLE) ×4
GOWN STRL REUS W/TWL XL LVL3 (GOWN DISPOSABLE) ×6
GRASPER SUT TROCAR 14GX15 (MISCELLANEOUS) IMPLANT
HEMOSTAT ARISTA ABSORB 3G PWDR (HEMOSTASIS) IMPLANT
IRRIGATION STRYKERFLOW (MISCELLANEOUS) ×2 IMPLANT
IRRIGATOR STRYKERFLOW (MISCELLANEOUS) ×2
IV LACTATED RINGERS 1000ML (IV SOLUTION) ×2 IMPLANT
KIT PINK PAD W/HEAD ARE REST (MISCELLANEOUS) ×2
KIT PINK PAD W/HEAD ARM REST (MISCELLANEOUS) ×2 IMPLANT
KIT TURNOVER CYSTO (KITS) ×2 IMPLANT
LABEL OR SOLS (LABEL) ×2 IMPLANT
MANIFOLD NEPTUNE II (INSTRUMENTS) ×2 IMPLANT
NDL HYPO 22X1.5 SAFETY MO (MISCELLANEOUS) ×2 IMPLANT
NEEDLE HYPO 22X1.5 SAFETY MO (MISCELLANEOUS) ×2 IMPLANT
PACK BASIN MINOR ARMC (MISCELLANEOUS) ×2 IMPLANT
PACK GYN LAPAROSCOPIC (MISCELLANEOUS) ×2 IMPLANT
PAD OB MATERNITY 4.3X12.25 (PERSONAL CARE ITEMS) ×2 IMPLANT
SCRUB CHG 4% DYNA-HEX 4OZ (MISCELLANEOUS) ×2 IMPLANT
SET CYSTO W/LG BORE CLAMP LF (SET/KITS/TRAYS/PACK) IMPLANT
SHEARS HARMONIC ACE PLUS 36CM (ENDOMECHANICALS) IMPLANT
SLEEVE Z-THREAD 5X100MM (TROCAR) ×4 IMPLANT
STRIP CLOSURE SKIN 1/2X4 (GAUZE/BANDAGES/DRESSINGS) ×2 IMPLANT
SUT MNCRL 4-0 (SUTURE)
SUT MNCRL 4-0 27XMFL (SUTURE)
SUT PDS 2-0 27IN (SUTURE) IMPLANT
SUT VIC AB 0 CT1 27 (SUTURE) ×4
SUT VIC AB 0 CT1 27XCR 8 STRN (SUTURE) ×4 IMPLANT
SUT VIC AB 0 CT1 36 (SUTURE) ×2 IMPLANT
SUT VIC AB 0 CT2 27 (SUTURE) IMPLANT
SUT VIC AB 2-0 SH 27 (SUTURE) ×2
SUT VIC AB 2-0 SH 27XBRD (SUTURE) ×2 IMPLANT
SUT VIC AB 2-0 UR6 27 (SUTURE) IMPLANT
SUT VIC AB 3-0 SH 27 (SUTURE) ×6
SUT VIC AB 3-0 SH 27X BRD (SUTURE) IMPLANT
SUT VIC AB 4-0 SH 27 (SUTURE) ×4
SUT VIC AB 4-0 SH 27XANBCTRL (SUTURE) ×2 IMPLANT
SUTURE MNCRL 4-0 27XMF (SUTURE) IMPLANT
SYR 10ML LL (SYRINGE) ×2 IMPLANT
SYR CONTROL 10ML LL (SYRINGE) ×2 IMPLANT
SYR TOOMEY IRRIG 70ML (MISCELLANEOUS) ×2
SYRINGE TOOMEY IRRIG 70ML (MISCELLANEOUS) IMPLANT
TRAP FLUID SMOKE EVACUATOR (MISCELLANEOUS) ×2 IMPLANT
TROCAR Z-THREAD FIOS 5X100MM (TROCAR) ×2 IMPLANT
TUBING EVAC SMOKE HEATED PNEUM (TUBING) ×2 IMPLANT
WATER STERILE IRR 500ML POUR (IV SOLUTION) ×2 IMPLANT

## 2022-07-29 NOTE — Progress Notes (Signed)
Here for LAVH  bilateral salpingectomy  . Labs reviewed . Neg HCG  All questions answered . Proceed

## 2022-07-29 NOTE — Brief Op Note (Signed)
07/29/2022  1:25 PM  PATIENT:  Kristie Woods  42 y.o. female  PRE-OPERATIVE DIAGNOSIS:  menorrhagia  POST-OPERATIVE DIAGNOSIS:  menorrhagia, Incidental cystotomy   PROCEDURE:  Procedure(s): LAPAROSCOPIC ASSISTED VAGINAL HYSTERECTOMY WITH SALPINGECTOMY (Bilateral) CYSTOSCOPY Repair of cystotomy  SURGEON:  Surgeon(s) and Role:    * Prosperity Darrough, Ihor Austin, MD - Primary    * Christeen Douglas, MD - Assisting  PHYSICIAN ASSISTANT: PA student Cochran  ASSISTANTS: none   ANESTHESIA:   general  EBL:  100 mL IOF 1200 uo 300cc  BLOOD ADMINISTERED:none  DRAINS: Urinary Catheter (Foley)   LOCAL MEDICATIONS USED:  MARCAINE     SPECIMEN:  Source of Specimen:  cervix , uterus , fallopian tubes   DISPOSITION OF SPECIMEN:  PATHOLOGY  COUNTS:  YES  TOURNIQUET:  * No tourniquets in log *  DICTATION: .Other Dictation: Dictation Number verbal  PLAN OF CARE: Discharge to home after PACU  PATIENT DISPOSITION:  PACU - hemodynamically stable.   Delay start of Pharmacological VTE agent (>24hrs) due to surgical blood loss or risk of bleeding: not applicable

## 2022-07-29 NOTE — Discharge Instructions (Addendum)
AMBULATORY SURGERY  DISCHARGE INSTRUCTIONS   The drugs that you were given will stay in your system until tomorrow so for the next 24 hours you should not:  Drive an automobile Make any legal decisions Drink any alcoholic beverage   You may resume regular meals tomorrow.  Today it is better to start with liquids and gradually work up to solid foods.  You may eat anything you prefer, but it is better to start with liquids, then soup and crackers, and gradually work up to solid foods.   Please notify your doctor immediately if you have any unusual bleeding, trouble breathing, redness and pain at the surgery site, drainage, fever, or pain not relieved by medication.     Please call to schedule your post-operative visit.  Additional Instructions:    

## 2022-07-29 NOTE — Anesthesia Procedure Notes (Addendum)
Procedure Name: Intubation Date/Time: 07/29/2022 10:08 AM  Performed by: Corinda Gubler, MDPre-anesthesia Checklist: Patient identified, Emergency Drugs available, Suction available and Patient being monitored Patient Re-evaluated:Patient Re-evaluated prior to induction Oxygen Delivery Method: Circle system utilized Preoxygenation: Pre-oxygenation with 100% oxygen Induction Type: IV induction Ventilation: Mask ventilation without difficulty Laryngoscope Size: McGraph and 3 Grade View: Grade I Tube type: Oral Tube size: 6.5 mm Number of attempts: 1 Airway Equipment and Method: Stylet and Video-laryngoscopy Placement Confirmation: ETT inserted through vocal cords under direct vision, positive ETCO2 and breath sounds checked- equal and bilateral Secured at: 22 cm Tube secured with: Tape Dental Injury: Teeth and Oropharynx as per pre-operative assessment

## 2022-07-29 NOTE — Anesthesia Preprocedure Evaluation (Signed)
Anesthesia Evaluation  Patient identified by MRN, date of birth, ID band Patient awake  General Assessment Comment:  Had some PONV after breast surgery  Reviewed: Allergy & Precautions, NPO status , Patient's Chart, lab work & pertinent test results  History of Anesthesia Complications Negative for: history of anesthetic complications  Airway Mallampati: II  TM Distance: >3 FB Neck ROM: Full    Dental no notable dental hx. (+) Teeth Intact   Pulmonary neg pulmonary ROS, neg sleep apnea, neg COPD, Patient abstained from smoking.Not current smoker, former smoker   Pulmonary exam normal breath sounds clear to auscultation       Cardiovascular Exercise Tolerance: Good METS(-) hypertension(-) CAD and (-) Past MI negative cardio ROS (-) dysrhythmias  Rhythm:Regular Rate:Normal - Systolic murmurs    Neuro/Psych  Headaches PSYCHIATRIC DISORDERS Anxiety        GI/Hepatic ,GERD  Controlled,,(+)     (-) substance abuse    Endo/Other  neg diabetes Hyperthyroidism Stable, no longer on meds (used to be on methimazole)  Renal/GU negative Renal ROS     Musculoskeletal   Abdominal   Peds  Hematology   Anesthesia Other Findings Past Medical History: No date: Anemia No date: Family history of adverse reaction to anesthesia     Comment:  mother with N/V No date: GERD (gastroesophageal reflux disease) 02/11/2007: H/O colposcopy with cervical biopsy     Comment:  [facility] No date: Hyperlipidemia No date: Hyperthyroidism 02/10/2009: Kidney stones No date: Menorrhagia No date: Migraine     Comment:  occasional, no regular meds, uses OTC Excedrine Migraine  Reproductive/Obstetrics                             Anesthesia Physical Anesthesia Plan  ASA: 2  Anesthesia Plan: General   Post-op Pain Management: Tylenol PO (pre-op)*, Gabapentin PO (pre-op)* and Toradol IV (intra-op)*   Induction:  Intravenous  PONV Risk Score and Plan: 4 or greater and Ondansetron, Dexamethasone, Midazolam, TIVA and Propofol infusion  Airway Management Planned: Oral ETT  Additional Equipment: None  Intra-op Plan:   Post-operative Plan: Extubation in OR  Informed Consent: I have reviewed the patients History and Physical, chart, labs and discussed the procedure including the risks, benefits and alternatives for the proposed anesthesia with the patient or authorized representative who has indicated his/her understanding and acceptance.     Dental advisory given  Plan Discussed with: CRNA and Surgeon  Anesthesia Plan Comments: (Discussed risks of anesthesia with patient, including PONV, sore throat, lip/dental/eye damage. Rare risks discussed as well, such as cardiorespiratory and neurological sequelae, and allergic reactions. Discussed the role of CRNA in patient's perioperative care. Patient understands.)       Anesthesia Quick Evaluation

## 2022-07-29 NOTE — Transfer of Care (Cosign Needed)
Immediate Anesthesia Transfer of Care Note  Patient: Kristie Woods  Procedure(s) Performed: LAPAROSCOPIC ASSISTED VAGINAL HYSTERECTOMY WITH BILATERAL SALPINGECTOMY (Bilateral: Abdomen) INCIDENTAL CYSTOTOMY WITH REPAIR; CYSTOSCOPY (Bladder)  Patient Location: PACU  Anesthesia Type:General  Level of Consciousness: drowsy  Airway & Oxygen Therapy: Patient Spontanous Breathing and Patient connected to face mask oxygen  Post-op Assessment: Report given to RN and Post -op Vital signs reviewed and stable  Post vital signs: Reviewed  Last Vitals:  Vitals Value Taken Time  BP 105/62 07/29/22 1336  Temp 36.4 C 07/29/22 1336  Pulse 71 07/29/22 1339  Resp 8 07/29/22 1339  SpO2 100 % 07/29/22 1339  Vitals shown include unvalidated device data.  Last Pain:  Vitals:   07/29/22 0803  TempSrc: Temporal  PainSc: 0-No pain         Complications: No notable events documented.

## 2022-07-29 NOTE — Anesthesia Postprocedure Evaluation (Signed)
Anesthesia Post Note  Patient: Kristie Woods  Procedure(s) Performed: LAPAROSCOPIC ASSISTED VAGINAL HYSTERECTOMY WITH BILATERAL SALPINGECTOMY (Bilateral: Abdomen) INCIDENTAL CYSTOTOMY WITH REPAIR; CYSTOSCOPY (Bladder)  Patient location during evaluation: PACU Anesthesia Type: General Level of consciousness: awake and alert Pain management: pain level controlled Vital Signs Assessment: post-procedure vital signs reviewed and stable Respiratory status: spontaneous breathing, nonlabored ventilation, respiratory function stable and patient connected to nasal cannula oxygen Cardiovascular status: blood pressure returned to baseline and stable Postop Assessment: no apparent nausea or vomiting Anesthetic complications: no   No notable events documented.   Last Vitals:  Vitals:   07/29/22 0803 07/29/22 1336  BP: 118/81 105/62  Pulse: 76 82  Resp: 16 12  Temp: 37.1 C 36.4 C  SpO2: 100% 100%    Last Pain:  Vitals:   07/29/22 1336  TempSrc:   PainSc: Asleep                 Corinda Gubler

## 2022-07-29 NOTE — Op Note (Unsigned)
NAME: RAEDEAN, COLAN MEDICAL RECORD NO: [id number] ACCOUNT NO: [id number] DATE OF BIRTH: 1980-11-27 FACILITY: [facility] LOCATION: [facility] PHYSICIAN: Suzy Bouchard, MD  Operative Report   DATE OF PROCEDURE: 07/29/2022  PREOPERATIVE DIAGNOSIS:  Menorrhagia.  POSTOPERATIVE DIAGNOSES:   1.  Menorrhagia. 2.  Incidental cystotomy.  PROCEDURE:   1.  Laparoscopic-assisted vaginal hysterectomy. 2.  Cystotomy repair. 3.  Bilateral salpingectomy.  SURGEON:  Suzy Bouchard, MD  FIRST ASSISTANT:  Christeen Douglas, MD  SECOND ASSISTANT:  PA student, Berna Bue.  ANESTHESIA:  General endotracheal anesthesia.  INDICATIONS:  A 42 year old gravida 2, para 2, status post 2 prior cesarean section.  The patient with a long history of menorrhagia, failing conservative treatment.  The patient has elected for definitive therapy.  DESCRIPTION OF PROCEDURE:  After adequate general endotracheal anesthesia, the patient was placed in dorsal supine position with the legs in the Greenwood stirrups.  The patient's abdomen, perineum and vagina were prepped and draped in normal sterile  fashion.  Timeout was performed.  A speculum was placed in the vagina and the anterior cervix was grasped with a single tooth tenaculum and a Cohen cannula was placed in the endocervical canal to be used for uterine manipulation during the procedure.  A  Foley catheter was placed into the bladder for passive drainage during the procedure.  Gloves and gown were changed.  Attention was directed to the patient's abdomen where a 5 mm infraumbilical incision was made after injecting with 0.5% Marcaine.  The 5  mm laparoscope was advanced into the abdominal cavity under direct visualization.  Gaining entrance into the abdominal cavity was uncomplicated and the patient's abdomen was insufflated with carbon dioxide.  Second port placement was placed left lower  quadrant 3 cm medial to the left anterior iliac spine and a 5 mm  trocar was advanced under direct visualization.  Third port was placed right lower quadrant, again 3 cm medial to the right anterior iliac spine and a 5 mm trocar was advanced.  Initial  impression revealed omental adhesions to the mid portion of the anterior abdominal wall.  The uterus was retroverted.  Ovaries and fallopian tubes appeared normal.  Upper abdomen appeared normal.  A Harmonic scalpel was brought up to the operative field  and the omental adhesions were removed without difficulty.  The left fallopian tube was grasped at the distal portion of the fallopian tube and the Harmonic scalpel was used to dissect the mesosalpinx to the level of the cornua.  Similar procedure was  repeated on the patient's right fallopian tube.  The broad ligament was then cauterized and incised down to the level of the uterine arteries bilaterally.  Each uterine artery was cauterized with the Kleppinger cautery and the anterior bladder flap was  dissected and the bladder was resected inferiorly.  Good hemostasis was noted at this point. Attention was then directed to the patient's vagina to complete the procedure.  The Foley catheter was removed and the cervix was grasped with 2 thyroid  tenaculums and a weighted speculum was placed in the posterior vaginal vault.  The posterior cul-de-sac was entered sharply with Mayo scissors.  Upon entry into the posterior cul-de-sac, the peritoneal edge was attached to the vaginal epithelium with an  0 Vicryl suture.  The long-billed speculum was placed into the posterior cul-de-sac.  Uterosacral ligaments were bilaterally clamped, transected and suture ligated with 0 Vicryl suture and tagged for later identification.  The cervix was  circumferentially incised with the Bovie.  Cardinal ligaments were then bilaterally clamped, transected and suture ligated with 0 Vicryl suture.  The bladder with some scar tissue was pushed off the lower uterine segment.  During this portion of the   procedure, a defect was noted which verified entrance into the bladder, i.e. incidental cystotomy.  Uterine arteries were then bilaterally clamped, transected and suture ligated with 0 Vicryl suture.  The anterior cul-de-sac was then entered beneath the  aforementioned bladder defect and the bladder was elevated anteriorly.  The cervix and uterus and fallopian tubes were then delivered through the incision.  Attention was directed to the bladder where the 3 cm defect was noted in a horizontal fashion.   The bladder was then repaired in a 2 layered fashion with 3-0 Vicryl suture.  Good approximation of tissues noted.  Good hemostasis noted.  The bladder was then retrofilled with methylene blue and sterile water with 220 mL of fluid filled.  There was no  leakage noted from the repair site.  The vaginal cuff was then closed with a running 0 Vicryl suture with good approximation of tissues and the uterosacral ligaments were plicated centrally and the rest of the vaginal vault was closed.  Gloves and gown  were changed.  Attention was directed to the patient's abdomen and the patient's abdomen was insufflated again.  There was some slow ooze from the right ovarian pedicle, which had been previously dissected off of the uterus at the level of the  uteroovarian ligament.  Bipolar cautery was used to control the slow ooze.  There was a small amount of oozing at the vaginal cuff due to a small vessel that was cauterized and good hemostasis was noted.  The patient's abdomen was irrigated and suctioned  and the bladder was then retrofilled yet again with 220 mL of the methylene blue solution. No spillage of the solution into the peritoneal cavity was noted.  The patient's bladder was again drained.  Arista was then placed over the vaginal vault bed and  the pressure was lowered to 7 mmHg and good hemostasis noted.  The patient's abdomen was then deflated and all three skin incisions were then closed with interrupted  4-0 Vicryl suture.  A cystoscopy was then performed at the end of the procedure,  filling the bladder with 250 mL normal saline and no suture was identified in the bladder mucosa. Ureteral orifices were identified and after injecting quarter mL of fluorescein intravenously, each ureteral orifice demonstrated normal plumming of the  fluorescein dyed urine.  No other defects were noted in the bladder.  Bladder was drained and a Foley catheter was placed.  The patient tolerated the procedure well.  INTRAOPERATIVE FLUIDS:  800 mL  ESTIMATED BLOOD LOSS:  100 mL  URINE OUTPUT:  300 mL  COMPLICATIONS:  Incidental cystotomy with repair.  The patient did receive 30 mg of Toradol at the end of the procedure in the PACU.   VAI D: 07/29/2022 9:27:38 pm T: 07/29/2022 9:44:00 pm  JOB: [id number]/ [id number]

## 2022-07-30 ENCOUNTER — Other Ambulatory Visit: Payer: Self-pay | Admitting: Obstetrics and Gynecology

## 2022-07-30 ENCOUNTER — Encounter: Payer: Self-pay | Admitting: Obstetrics and Gynecology

## 2022-07-30 DIAGNOSIS — Z9350 Unspecified cystostomy status: Secondary | ICD-10-CM

## 2022-08-05 ENCOUNTER — Other Ambulatory Visit: Payer: Self-pay

## 2022-08-05 DIAGNOSIS — R310 Gross hematuria: Secondary | ICD-10-CM | POA: Diagnosis not present

## 2022-08-05 DIAGNOSIS — E0500 Thyrotoxicosis with diffuse goiter without thyrotoxic crisis or storm: Secondary | ICD-10-CM | POA: Diagnosis not present

## 2022-08-05 DIAGNOSIS — R300 Dysuria: Secondary | ICD-10-CM | POA: Diagnosis not present

## 2022-08-05 MED ORDER — CIPROFLOXACIN HCL 250 MG PO TABS: 250.0000 mg | ORAL_TABLET | Freq: Two times a day (BID) | ORAL | 0 refills | Status: DC

## 2022-08-08 ENCOUNTER — Ambulatory Visit
Admission: RE | Admit: 2022-08-08 | Discharge: 2022-08-08 | Disposition: A | Discharge status: Home / Self Care | Payer: 59 | Source: Ambulatory Visit | Attending: Obstetrics and Gynecology | Admitting: Obstetrics and Gynecology

## 2022-08-08 DIAGNOSIS — Z9359 Other cystostomy status: Secondary | ICD-10-CM | POA: Diagnosis not present

## 2022-08-08 DIAGNOSIS — Z9350 Unspecified cystostomy status: Secondary | ICD-10-CM | POA: Diagnosis present

## 2022-08-08 DIAGNOSIS — R188 Other ascites: Secondary | ICD-10-CM | POA: Diagnosis not present

## 2022-08-08 MED ORDER — IOTHALAMATE MEGLUMINE 17.2 % UR SOLN
250.0000 mL | Freq: Once | URETHRAL | Status: AC | PRN
  Administered 2022-08-08: 250 mL via INTRAVESICAL

## 2022-08-08 MED ORDER — IOHEXOL 300 MG/ML  SOLN
100.0000 mL | Freq: Once | INTRAMUSCULAR | Status: AC | PRN
  Administered 2022-08-08: 100 mL via INTRAVENOUS

## 2022-12-17 DIAGNOSIS — Z86018 Personal history of other benign neoplasm: Secondary | ICD-10-CM | POA: Diagnosis not present

## 2022-12-17 DIAGNOSIS — L578 Other skin changes due to chronic exposure to nonionizing radiation: Secondary | ICD-10-CM | POA: Diagnosis not present

## 2023-04-14 DIAGNOSIS — Z01419 Encounter for gynecological examination (general) (routine) without abnormal findings: Secondary | ICD-10-CM | POA: Diagnosis not present

## 2023-04-14 DIAGNOSIS — Z1211 Encounter for screening for malignant neoplasm of colon: Secondary | ICD-10-CM | POA: Diagnosis not present

## 2023-04-14 DIAGNOSIS — Z1231 Encounter for screening mammogram for malignant neoplasm of breast: Secondary | ICD-10-CM | POA: Diagnosis not present

## 2023-04-28 ENCOUNTER — Encounter: Payer: 59 | Admitting: Family Medicine

## 2023-04-28 ENCOUNTER — Encounter: Payer: Self-pay | Admitting: Family Medicine

## 2023-04-28 ENCOUNTER — Other Ambulatory Visit: Payer: Self-pay

## 2023-04-28 ENCOUNTER — Ambulatory Visit: Payer: 59 | Admitting: Family Medicine

## 2023-04-28 VITALS — BP 92/62 | HR 87 | Temp 98.0°F | Resp 20 | Ht 64.5 in | Wt 146.1 lb

## 2023-04-28 DIAGNOSIS — E538 Deficiency of other specified B group vitamins: Secondary | ICD-10-CM | POA: Diagnosis not present

## 2023-04-28 DIAGNOSIS — E782 Mixed hyperlipidemia: Secondary | ICD-10-CM | POA: Diagnosis not present

## 2023-04-28 DIAGNOSIS — F40243 Fear of flying: Secondary | ICD-10-CM | POA: Diagnosis not present

## 2023-04-28 DIAGNOSIS — E0590 Thyrotoxicosis, unspecified without thyrotoxic crisis or storm: Secondary | ICD-10-CM | POA: Diagnosis not present

## 2023-04-28 DIAGNOSIS — E559 Vitamin D deficiency, unspecified: Secondary | ICD-10-CM

## 2023-04-28 DIAGNOSIS — D509 Iron deficiency anemia, unspecified: Secondary | ICD-10-CM | POA: Diagnosis not present

## 2023-04-28 LAB — COMPREHENSIVE METABOLIC PANEL
ALT: 9 U/L (ref 0–35)
AST: 13 U/L (ref 0–37)
Albumin: 4.6 g/dL (ref 3.5–5.2)
Alkaline Phosphatase: 48 U/L (ref 39–117)
BUN: 15 mg/dL (ref 6–23)
CO2: 25 meq/L (ref 19–32)
Calcium: 9.7 mg/dL (ref 8.4–10.5)
Chloride: 104 meq/L (ref 96–112)
Creatinine, Ser: 0.85 mg/dL (ref 0.40–1.20)
GFR: 83.81 mL/min (ref >60.00)
Glucose, Bld: 85 mg/dL (ref 70–99)
Potassium: 4.2 meq/L (ref 3.5–5.1)
Sodium: 139 meq/L (ref 135–145)
Total Bilirubin: 1.4 mg/dL — ABNORMAL HIGH (ref 0.2–1.2)
Total Protein: 7 g/dL (ref 6.0–8.3)

## 2023-04-28 LAB — CBC WITH DIFFERENTIAL/PLATELET
Basophils Absolute: 0 10*3/uL (ref 0.0–0.1)
Basophils Relative: 0.5 % (ref 0.0–3.0)
Eosinophils Absolute: 0.1 10*3/uL (ref 0.0–0.7)
Eosinophils Relative: 1.6 % (ref 0.0–5.0)
HCT: 39.2 % (ref 36.0–46.0)
Hemoglobin: 13.3 g/dL (ref 12.0–15.0)
Lymphocytes Relative: 26.7 % (ref 12.0–46.0)
Lymphs Abs: 1.4 10*3/uL (ref 0.7–4.0)
MCHC: 33.8 g/dL (ref 30.0–36.0)
MCV: 87.4 fl (ref 78.0–100.0)
Monocytes Absolute: 0.5 10*3/uL (ref 0.1–1.0)
Monocytes Relative: 8.9 % (ref 3.0–12.0)
Neutro Abs: 3.2 10*3/uL (ref 1.4–7.7)
Neutrophils Relative %: 62.3 % (ref 43.0–77.0)
Platelets: 252 10*3/uL (ref 150.0–400.0)
RBC: 4.48 Mil/uL (ref 3.87–5.11)
RDW: 12.5 % (ref 11.5–15.5)
WBC: 5.2 10*3/uL (ref 4.0–10.5)

## 2023-04-28 LAB — LIPID PANEL
Cholesterol: 230 mg/dL — ABNORMAL HIGH (ref 0–200)
HDL: 70.5 mg/dL (ref >39.00)
LDL Cholesterol: 145 mg/dL — ABNORMAL HIGH (ref 0–99)
NonHDL: 159.32
Total CHOL/HDL Ratio: 3
Triglycerides: 74 mg/dL (ref 0.0–149.0)
VLDL: 14.8 mg/dL (ref 0.0–40.0)

## 2023-04-28 LAB — VITAMIN D 25 HYDROXY (VIT D DEFICIENCY, FRACTURES): VITD: 45.7 ng/mL (ref 30.00–100.00)

## 2023-04-28 LAB — VITAMIN B12: Vitamin B-12: 406 pg/mL (ref 211–911)

## 2023-04-28 MED ORDER — ALPRAZOLAM 0.25 MG PO TABS
0.2500 mg | ORAL_TABLET | Freq: Once | ORAL | 0 refills | Status: DC | PRN
Start: 2023-04-28 — End: 2023-10-02
  Filled 2023-04-28: qty 10, 10d supply, fill #0

## 2023-04-28 NOTE — Progress Notes (Unsigned)
 SUBJECTIVE:   Chief Complaint  Patient presents with   Establish Care    Transfer from Dr. Birdie Sons   HPI Presents to transfer care  Discussed the use of AI scribe software for clinical note transcription with the patient, who gave verbal consent to proceed.  History of Present Illness Kristie Woods is a 43 year old female with Graves' disease who presents for transfer of care and medication review.  She has a history of Graves' disease, initially diagnosed when her blood pressure began to rise and she experienced weight gain. She was treated with medication, which led to further weight gain, but her thyroid levels have since stabilized, allowing her to discontinue the medication last year. She currently has no symptoms and manages her condition with semaglutide injections, started approximately six to seven weeks ago, aiding in a weight loss of about 20-25 pounds. Her blood pressure has normalized. No chest pain, shortness of breath, or symptoms related to her Graves' disease are present. Previously, her heart rate was elevated at 120 bpm while resting, but this has improved.  She underwent a hysterectomy in June of last year due to very heavy bleeding and reports no need for Pap smears following the surgery. She continues to see her OB at [facility], who delivered both of her children, and has recently received a mammogram order from her.  She experiences anxiety related to flying and heights, for which she takes Xanax as needed, typically receiving a prescription for ten pills per year. She plans to travel soon and requires a refill.  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 She has upcoming travel plans, including a cruise in April and trips to [place] and [place].      PERTINENT PMH / PSH: As above  OBJECTIVE:  BP 92/62   Pulse 87   Temp  98 F (36.7 C)   Resp 20   Ht 5' 4.5" (1.638 m)   Wt 146 lb 2 oz (66.3 kg)   LMP 07/11/2022 (Approximate) Comment: neg preg test  SpO2 99%   BMI 24.69 kg/m    Physical Exam Vitals reviewed.  Constitutional:      General: She is not in acute distress.    Appearance: She is not ill-appearing.  HENT:     Head: Normocephalic.     Right Ear: Tympanic membrane, ear canal and external ear normal.     Left Ear: Tympanic membrane, ear canal and external ear normal.     Nose: Nose normal.     Mouth/Throat:     Mouth: Mucous membranes are moist.  Eyes:     Extraocular Movements: Extraocular movements intact.     Conjunctiva/sclera: Conjunctivae normal.     Pupils: Pupils are equal, round, and reactive to light.  Neck:     Thyroid: No thyromegaly or thyroid tenderness.     Vascular: No carotid bruit.  Cardiovascular:     Rate and Rhythm: Normal rate and regular rhythm.     Pulses: Normal pulses.     Heart sounds: Normal heart sounds.  Pulmonary:     Effort: Pulmonary effort is normal.     Breath sounds: Normal breath sounds.  Abdominal:     General: Bowel sounds are normal. There is no distension.     Palpations: Abdomen is soft.     Tenderness: There is  no abdominal tenderness. There is no right CVA tenderness, left CVA tenderness, guarding or rebound.  Musculoskeletal:        General: Normal range of motion.     Cervical back: Normal range of motion.     Right lower leg: No edema.     Left lower leg: No edema.  Lymphadenopathy:     Cervical: No cervical adenopathy.  Skin:    Capillary Refill: Capillary refill takes less than 2 seconds.  Neurological:     General: No focal deficit present.     Mental Status: She is alert and oriented to person, place, and time. Mental status is at baseline.     Motor: No weakness.  Psychiatric:        Mood and Affect: Mood normal.        Behavior: Behavior normal.        Thought Content: Thought content normal.        Judgment: Judgment  normal.    {Perform Simple Foot Exam  Perform Detailed exam:1} {Insert foot Exam (Optional):30965}      04/28/2023    9:22 AM 05/12/2022   10:26 AM 04/23/2022   11:51 AM 08/19/2021    8:31 AM 03/26/2021    8:49 AM  Depression screen PHQ 2/9  Decreased Interest 0 0 0 0 0  Down, Depressed, Hopeless 0 0 0 0 0  PHQ - 2 Score 0 0 0 0 0  Altered sleeping 1 1 3     Tired, decreased energy 1 1 1     Change in appetite 0 1 0    Feeling bad or failure about yourself  0 0 0    Trouble concentrating 0 0 0    Moving slowly or fidgety/restless 0 0 0    Suicidal thoughts 0 0 0    PHQ-9 Score 2 3 4     Difficult doing work/chores Not difficult at all Not difficult at all Not difficult at all        04/28/2023    9:22 AM 05/12/2022   10:26 AM 04/23/2022   11:51 AM  GAD 7 : Generalized Anxiety Score  Nervous, Anxious, on Edge 0 1 1  Control/stop worrying 0 0 0  Worry too much - different things 0 1 0  Trouble relaxing 0 0 0  Restless 0 0 0  Easily annoyed or irritable 0 0 1  Afraid - awful might happen 0 0 0  Total GAD 7 Score 0 2 2  Anxiety Difficulty Not difficult at all Not difficult at all Not difficult at all    ASSESSMENT/PLAN:  Iron deficiency anemia, unspecified iron deficiency anemia type -     CBC with Differential/Platelet  Fear of flying -     ALPRAZolam; Take 1 tablet (0.25 mg total) by mouth once as needed for up to 1 dose for anxiety (with flying).  Dispense: 10 tablet; Refill: 0  Hyperthyroidism -     Thyroid Panel With TSH -     Comprehensive metabolic panel  Vitamin D deficiency -     VITAMIN D 25 Hydroxy (Vit-D Deficiency, Fractures)  Vitamin B 12 deficiency -     Vitamin B12  Mixed hyperlipidemia -     Lipid panel    Assessment and Plan    Graves' Disease Thyroid levels stabilized, no current symptoms. Athlete managing weight with semaglutide. Monitoring heart rate due to previous tachycardia. - Order TSH, T3, T4, and TSI blood tests to monitor thyroid  function. - Share  blood test results with endocrinologist for further evaluation. - Follow up with endocrinologist in June for reassessment.  Hypertension Blood pressure normalized with weight loss and semaglutide. Current BP 100/62 mmHg.  Vitamin D Deficiency Takes 2000 IU daily, no symptoms of deficiency. - Order vitamin D level to ensure adequate supplementation.  Anxiety Anxiety related to flying and heights, uses Xanax as needed. - Refill Xanax prescription for situational use.  General Health Maintenance Up to date with mammogram, no Pap smear needed post-hysterectomy, regular self-breast exams, vaccinations current. - Perform comprehensive blood work including cholesterol levels and vitamin B12. - Discuss colonoscopy or Cologuard screening at age 6. - Continue regular self-breast exams.          PDMP reviewed  Return if symptoms worsen or fail to improve, for PCP.  Dana Allan, MD

## 2023-04-28 NOTE — Patient Instructions (Signed)
 It was a pleasure meeting you today. Thank you for allowing me to take part in your health care.  Our goals for today as we discussed include:  We will get some labs today.  If they are abnormal or we need to do something about them, I will call you.  If they are normal, I will send you a message on MyChart (if it is active) or a letter in the mail.  If you don't hear from [place] in 2 weeks, please call the office at the number below.     This is a list of the screening recommended for you and due dates:  Health Maintenance  Topic Date Due   Pap with HPV screening  01/11/2019   DTaP/Tdap/Td vaccine (3 - Td or Tdap) 11/28/2024   Flu Shot  Completed   HIV Screening  Completed   HPV Vaccine  Aged Out   COVID-19 Vaccine  Discontinued   Hepatitis C Screening  Discontinued      If you have any questions or concerns, please do not hesitate to call the office at [phone].  I look forward to our next visit and until then take care and stay safe.  Regards,   Dana Allan, MD   [facility]

## 2023-04-29 LAB — THYROID PANEL WITH TSH
Free Thyroxine Index: 2.9 (ref 1.4–3.8)
T3 Uptake: 32 % (ref 22–35)
T4, Total: 9.1 ug/dL (ref 5.1–11.9)
TSH: 2 m[IU]/L

## 2023-05-01 ENCOUNTER — Encounter: Payer: Self-pay | Admitting: Family Medicine

## 2023-05-01 DIAGNOSIS — E538 Deficiency of other specified B group vitamins: Secondary | ICD-10-CM | POA: Insufficient documentation

## 2023-05-01 DIAGNOSIS — E559 Vitamin D deficiency, unspecified: Secondary | ICD-10-CM | POA: Insufficient documentation

## 2023-05-01 DIAGNOSIS — E782 Mixed hyperlipidemia: Secondary | ICD-10-CM | POA: Insufficient documentation

## 2023-05-01 NOTE — Assessment & Plan Note (Signed)
 Not on statin therapy Athletic and managing weight with semaglutide Check fasting lipids

## 2023-05-01 NOTE — Assessment & Plan Note (Addendum)
 Thyroid levels stabilized, no current symptoms. Monitoring heart rate due to previous tachycardia. Previously on Methimazole, discontinued by Endocrinology given weight gain.   - Check Thyroid panel - Share blood test results with endocrinologist for further evaluation and has follow up in June for reassessment.

## 2023-05-01 NOTE — Assessment & Plan Note (Signed)
 Anxiety related to flying and heights, uses Xanax as needed. - Refill Xanax 0.25 mg  prescription for situational use.

## 2023-05-01 NOTE — Assessment & Plan Note (Signed)
 Takes 2000 IU daily, no symptoms of deficiency. - Order vitamin D level to ensure adequate supplementation.

## 2023-05-01 NOTE — Assessment & Plan Note (Signed)
 Check Vitamin B 12 level

## 2023-06-03 ENCOUNTER — Other Ambulatory Visit: Payer: Self-pay | Admitting: Obstetrics and Gynecology

## 2023-06-03 DIAGNOSIS — Z1231 Encounter for screening mammogram for malignant neoplasm of breast: Secondary | ICD-10-CM

## 2023-07-09 ENCOUNTER — Ambulatory Visit
Admission: RE | Admit: 2023-07-09 | Discharge: 2023-07-09 | Disposition: A | Discharge status: Home / Self Care | Source: Ambulatory Visit | Attending: Obstetrics and Gynecology | Admitting: Obstetrics and Gynecology

## 2023-07-09 DIAGNOSIS — Z1231 Encounter for screening mammogram for malignant neoplasm of breast: Secondary | ICD-10-CM | POA: Insufficient documentation

## 2023-10-05 ENCOUNTER — Ambulatory Visit

## 2023-10-05 ENCOUNTER — Other Ambulatory Visit: Payer: Self-pay

## 2023-10-05 VITALS — BP 110/62 | HR 90 | Temp 98.8°F | Ht 64.0 in | Wt 146.2 lb

## 2023-10-05 DIAGNOSIS — F40243 Fear of flying: Secondary | ICD-10-CM | POA: Diagnosis not present

## 2023-10-05 DIAGNOSIS — E0590 Thyrotoxicosis, unspecified without thyrotoxic crisis or storm: Secondary | ICD-10-CM

## 2023-10-05 DIAGNOSIS — E782 Mixed hyperlipidemia: Secondary | ICD-10-CM | POA: Diagnosis not present

## 2023-10-05 DIAGNOSIS — Z131 Encounter for screening for diabetes mellitus: Secondary | ICD-10-CM | POA: Diagnosis not present

## 2023-10-05 DIAGNOSIS — E538 Deficiency of other specified B group vitamins: Secondary | ICD-10-CM

## 2023-10-05 DIAGNOSIS — D509 Iron deficiency anemia, unspecified: Secondary | ICD-10-CM

## 2023-10-05 DIAGNOSIS — E559 Vitamin D deficiency, unspecified: Secondary | ICD-10-CM | POA: Diagnosis not present

## 2023-10-05 DIAGNOSIS — E663 Overweight: Secondary | ICD-10-CM

## 2023-10-05 MED ORDER — ALPRAZOLAM 0.25 MG PO TABS
0.2500 mg | ORAL_TABLET | Freq: Once | ORAL | 0 refills | Status: AC | PRN
Start: 2023-10-05 — End: ?
  Filled 2023-10-05: qty 10, 10d supply, fill #0

## 2023-10-05 NOTE — Assessment & Plan Note (Signed)
 The 10-year ASCVD risk score (Arnett DK, et al., 2019) is: 0.5%   Values used to calculate the score:     Age: 43 years     Clincally relevant sex: Female     Is Non-Hispanic African American: No     Diabetic: No     Tobacco smoker: No     Systolic Blood Pressure: 110 mmHg     Is BP treated: No     HDL Cholesterol: 70.5 mg/dL     Total Cholesterol: 230 mg/dL Recommend obtaining fasting lipid panel.  Patient agreeable.  Lab ordered.

## 2023-10-05 NOTE — Assessment & Plan Note (Signed)
 Check A1c.

## 2023-10-05 NOTE — Progress Notes (Signed)
 Established Patient Office Visit TOC from Dr. Eual    Subjective  Patient ID: Kristie Woods, female    DOB: 10-Aug-1980  Age: 43 y.o. MRN: [id number]  Chief Complaint  Patient presents with   Transitions Of Care    Transfer of Care    She  has a past medical history of Anemia, Anxiety, Family history of adverse reaction to anesthesia, GBS (group B streptococcus) infection (01/26/2015), GERD (gastroesophageal reflux disease), H/O cesarean section complicating pregnancy (02/16/2015), H/O colposcopy with cervical biopsy (02/11/2007), Hyperlipidemia, Hyperthyroidism, Kidney stones (02/10/2009), Menorrhagia, and Migraine.  HPI Discussed the use of AI scribe software for clinical note transcription with the patient, who gave verbal consent to proceed.   History of Present Illness Kristie Woods is a 43 year old female with Graves disease who presents to establish care.    - She has been experiencing hair loss and weight gain since May or June. She has a h/o Graves disease and is concerned this may be related to abnormal thyroid  hormone level.  Her thyroid  hormone panel was last checked in March and was normalized at that time. She has not seen endocrinologist recently. She is currently experiencing symptoms such as night sweats, although not as severe as during her previous flare-up of Graves disease two to three years ago. No palpitations are present. She is not sure if her symptoms premenopausal state, which shares some symptoms with her thyroid  condition.  - She has a history of elevated LDL cholesterol.  Managed with lifestyle modification.  - Patient has fear of flying and takes Xanax  as needed.  She reports she has upcoming trip to out of state multiple times later this year.  - She takes vitamin D  daily, 2000 international units for history of low vitamin D  in the past.  She is currently not taking multivitamin.  She follows up with OB/GYN for annual OB/GYN  at  [facility].  She had mammogram done on 07/09/2023 with normal mammogram finding.  - She reports stress related to her job  She experiences sleep disturbances, waking up early regardless of bedtime, and attributes this to stress and possibly medication.     ROS As per HPI    Objective:     BP 110/62 (BP Location: Left Arm, Patient Position: Sitting, Cuff Size: Normal)   Pulse 90   Temp 98.8 F (37.1 C) (Oral)   Ht 5' 4 (1.626 m)   Wt 146 lb 3.2 oz (66.3 kg)   LMP 07/11/2022 (Approximate) Comment: neg preg test  SpO2 98%   BMI 25.10 kg/m      10/05/2023    3:12 PM 04/28/2023    9:22 AM 05/12/2022   10:26 AM  Depression screen PHQ 2/9  Decreased Interest 0 0 0  Down, Depressed, Hopeless 0 0 0  PHQ - 2 Score 0 0 0  Altered sleeping 1 1 1   Tired, decreased energy 1 1 1   Change in appetite 1 0 1  Feeling bad or failure about yourself  0 0 0  Trouble concentrating 0 0 0  Moving slowly or fidgety/restless 0 0 0  Suicidal thoughts 0 0 0  PHQ-9 Score 3 2 3   Difficult doing work/chores Not difficult at all Not difficult at all Not difficult at all      10/05/2023    3:12 PM 04/28/2023    9:22 AM 05/12/2022   10:26 AM 04/23/2022   11:51 AM  GAD 7 : Generalized Anxiety Score  Nervous,  Anxious, on Edge 0 0 1 1  Control/stop worrying 0 0 0 0  Worry too much - different things 0 0 1 0  Trouble relaxing 0 0 0 0  Restless 0 0 0 0  Easily annoyed or irritable 0 0 0 1  Afraid - awful might happen 0 0 0 0  Total GAD 7 Score 0 0 2 2  Anxiety Difficulty Not difficult at all Not difficult at all Not difficult at all Not difficult at all      10/05/2023    3:12 PM 04/28/2023    9:22 AM 05/12/2022   10:26 AM  Depression screen PHQ 2/9  Decreased Interest 0 0 0  Down, Depressed, Hopeless 0 0 0  PHQ - 2 Score 0 0 0  Altered sleeping 1 1 1   Tired, decreased energy 1 1 1   Change in appetite 1 0 1  Feeling bad or failure about yourself  0 0 0  Trouble concentrating 0 0 0  Moving  slowly or fidgety/restless 0 0 0  Suicidal thoughts 0 0 0  PHQ-9 Score 3 2 3   Difficult doing work/chores Not difficult at all Not difficult at all Not difficult at all      10/05/2023    3:12 PM 04/28/2023    9:22 AM 05/12/2022   10:26 AM 04/23/2022   11:51 AM  GAD 7 : Generalized Anxiety Score  Nervous, Anxious, on Edge 0 0 1 1  Control/stop worrying 0 0 0 0  Worry too much - different things 0 0 1 0  Trouble relaxing 0 0 0 0  Restless 0 0 0 0  Easily annoyed or irritable 0 0 0 1  Afraid - awful might happen 0 0 0 0  Total GAD 7 Score 0 0 2 2  Anxiety Difficulty Not difficult at all Not difficult at all Not difficult at all Not difficult at all   SDOH Screenings   Food Insecurity: No Food Insecurity (04/14/2023)   Received from [facility]  Housing: Unknown (04/14/2023)   Received from [facility]  Transportation Needs: No Transportation Needs (04/14/2023)   Received from [facility]  Utilities: Not At Risk (04/14/2023)   Received from [facility]  Depression [phone]): Low Risk  (10/05/2023)  Financial Resource Strain: Low Risk  (04/14/2023)   Received from [facility]  Tobacco Use: Medium Risk (10/05/2023)     Physical Exam Constitutional:      General: She is not in acute distress.    Appearance: Normal appearance.  HENT:     Head: Normocephalic and atraumatic.     Right Ear: Tympanic membrane normal.     Left Ear: Tympanic membrane normal.     Mouth/Throat:     Mouth: Mucous membranes are moist.  Eyes:     Conjunctiva/sclera: Conjunctivae normal.     Comments: No proptosis noted  Neck:     Thyroid : No thyroid  mass or thyroid  tenderness.  Cardiovascular:     Rate and Rhythm: Normal rate and regular rhythm.  Pulmonary:     Effort: Pulmonary effort is normal.     Breath sounds: Normal breath sounds.  Abdominal:     General: Bowel sounds are normal.     Palpations: Abdomen is soft.      Tenderness: There is no guarding or rebound.  Musculoskeletal:     Cervical back: Neck supple. No rigidity.     Right lower leg: No  edema.     Left lower leg: No edema.  Skin:    General: Skin is warm.  Neurological:     Mental Status: She is alert and oriented to person, place, and time.     Gait: Gait normal.  Psychiatric:        Mood and Affect: Mood normal.        Behavior: Behavior normal.        No results found for any visits on 10/05/23.  The 10-year ASCVD risk score (Arnett DK, et al., 2019) is: 0.5%     Assessment & Plan:   Fear of flying Assessment & Plan: Chronic and stable.  Anxiety related to flying and heights, uses Xanax  as needed.  Has upcoming trip.  PDMP reviewed.  Last refill for Xanax  0.25 mg, 10 tablets were prescribed in March 2025.  Refill on Xanax  0.25 mg (10 tablets) was done today for situational use only.    Orders: -     ALPRAZolam ; Take 1 tablet (0.25 mg total) by mouth once as needed for up to 1 dose for anxiety (with flying).  Dispense: 10 tablet; Refill: 0  Hyperthyroidism Assessment & Plan: Thyroid  levels stabilized, on recent lab.  Vital stable.  She has seen endocrinologist in the past but since her thyroid  hormone level has been normalized she has not been following up with them.  She did notice weight gain when she was taking methimazole  in the past.  Currently she is not on any thyroid  medication.  We will check TSH, T3, T4.  If abnormal lab recommend reestablishing care with endocrinologist.  Orders: -     Comprehensive metabolic panel with GFR; Future -     TSH; Future -     T4, free; Future -     T3, free; Future  Mixed hyperlipidemia Assessment & Plan: The 10-year ASCVD risk score (Arnett DK, et al., 2019) is: 0.5%   Values used to calculate the score:     Age: 37 years     Clincally relevant sex: Female     Is Non-Hispanic African American: No     Diabetic: No     Tobacco smoker: No     Systolic Blood Pressure: 110  mmHg     Is BP treated: No     HDL Cholesterol: 70.5 mg/dL     Total Cholesterol: 230 mg/dL Recommend obtaining fasting lipid panel.  Patient agreeable.  Lab ordered.  Orders: -     Lipid panel; Future  Encounter for screening examination for intermediate hyperglycemia and diabetes mellitus Assessment & Plan: Check A1c.  Orders: -     Hemoglobin A1c; Future  Vitamin B 12 deficiency Assessment & Plan: Check vitamin B12 level.   Vitamin D  deficiency Assessment & Plan: Vitamin D  level over the past has been normal.  Repeat vitamin D  level today.  Continue vitamin D  2000 units daily.  Orders: -     Vitamin B12; Future -     VITAMIN D  25 Hydroxy (Vit-D Deficiency, Fractures); Future  Hyperbilirubinemia Assessment & Plan: Noted on previous CMP.  She has had RUQ ultrasound in the past which was reassuring.  Repeat CMP.      Return for Fasting lab on 8/26 and f/u with Dr. Abbey in 6 Months .   Luke Abbey, MD

## 2023-10-05 NOTE — Assessment & Plan Note (Signed)
 Noted on previous CMP.  She has had RUQ ultrasound in the past which was reassuring.  Repeat CMP.

## 2023-10-05 NOTE — Assessment & Plan Note (Signed)
 Vitamin D  level over the past has been normal.  Repeat vitamin D  level today.  Continue vitamin D  2000 units daily.

## 2023-10-05 NOTE — Assessment & Plan Note (Signed)
 Chronic and stable.  Anxiety related to flying and heights, uses Xanax  as needed.  Has upcoming trip.  PDMP reviewed.  Last refill for Xanax  0.25 mg, 10 tablets were prescribed in March 2025.  Refill on Xanax  0.25 mg (10 tablets) was done today for situational use only.

## 2023-10-05 NOTE — Assessment & Plan Note (Signed)
 Thyroid  levels stabilized, on recent lab.  Vital stable.  She has seen endocrinologist in the past but since her thyroid  hormone level has been normalized she has not been following up with them.  She did notice weight gain when she was taking methimazole  in the past.  Currently she is not on any thyroid  medication.  We will check TSH, T3, T4.  If abnormal lab recommend reestablishing care with endocrinologist.

## 2023-10-05 NOTE — Assessment & Plan Note (Signed)
Check vitamin B12 level

## 2023-10-06 ENCOUNTER — Other Ambulatory Visit (INDEPENDENT_AMBULATORY_CARE_PROVIDER_SITE_OTHER)

## 2023-10-06 ENCOUNTER — Ambulatory Visit: Payer: Self-pay

## 2023-10-06 DIAGNOSIS — Z131 Encounter for screening for diabetes mellitus: Secondary | ICD-10-CM | POA: Diagnosis not present

## 2023-10-06 DIAGNOSIS — E782 Mixed hyperlipidemia: Secondary | ICD-10-CM | POA: Diagnosis not present

## 2023-10-06 DIAGNOSIS — E559 Vitamin D deficiency, unspecified: Secondary | ICD-10-CM

## 2023-10-06 DIAGNOSIS — E0590 Thyrotoxicosis, unspecified without thyrotoxic crisis or storm: Secondary | ICD-10-CM

## 2023-10-06 LAB — LIPID PANEL
Cholesterol: 249 mg/dL — ABNORMAL HIGH (ref 0–200)
HDL: 87.7 mg/dL (ref >39.00)
LDL Cholesterol: 138 mg/dL — ABNORMAL HIGH (ref 0–99)
NonHDL: 161.01
Total CHOL/HDL Ratio: 3
Triglycerides: 117 mg/dL (ref 0.0–149.0)
VLDL: 23.4 mg/dL (ref 0.0–40.0)

## 2023-10-06 LAB — COMPREHENSIVE METABOLIC PANEL WITH GFR
ALT: 10 U/L (ref 0–35)
AST: 13 U/L (ref 0–37)
Albumin: 4.3 g/dL (ref 3.5–5.2)
Alkaline Phosphatase: 45 U/L (ref 39–117)
BUN: 17 mg/dL (ref 6–23)
CO2: 28 meq/L (ref 19–32)
Calcium: 9.1 mg/dL (ref 8.4–10.5)
Chloride: 100 meq/L (ref 96–112)
Creatinine, Ser: 0.83 mg/dL (ref 0.40–1.20)
GFR: 85.98 mL/min (ref >60.00)
Glucose, Bld: 86 mg/dL (ref 70–99)
Potassium: 3.9 meq/L (ref 3.5–5.1)
Sodium: 137 meq/L (ref 135–145)
Total Bilirubin: 1.8 mg/dL — ABNORMAL HIGH (ref 0.2–1.2)
Total Protein: 6.8 g/dL (ref 6.0–8.3)

## 2023-10-06 LAB — VITAMIN D 25 HYDROXY (VIT D DEFICIENCY, FRACTURES): VITD: 61.15 ng/mL (ref 30.00–100.00)

## 2023-10-06 LAB — TSH: TSH: 2.34 u[IU]/mL (ref 0.35–5.50)

## 2023-10-06 LAB — VITAMIN B12: Vitamin B-12: 244 pg/mL (ref 211–911)

## 2023-10-06 LAB — T4, FREE: Free T4: 0.87 ng/dL (ref 0.60–1.60)

## 2023-10-06 LAB — HEMOGLOBIN A1C: Hgb A1c MFr Bld: 5.6 % (ref 4.6–6.5)

## 2023-10-06 LAB — T3, FREE: T3, Free: 3.1 pg/mL (ref 2.3–4.2)

## 2023-12-22 ENCOUNTER — Telehealth: Admitting: Family Medicine

## 2023-12-22 ENCOUNTER — Other Ambulatory Visit: Payer: Self-pay

## 2023-12-22 DIAGNOSIS — L578 Other skin changes due to chronic exposure to nonionizing radiation: Secondary | ICD-10-CM | POA: Diagnosis not present

## 2023-12-22 DIAGNOSIS — D485 Neoplasm of uncertain behavior of skin: Secondary | ICD-10-CM | POA: Diagnosis not present

## 2023-12-22 DIAGNOSIS — R300 Dysuria: Secondary | ICD-10-CM

## 2023-12-22 DIAGNOSIS — Z86018 Personal history of other benign neoplasm: Secondary | ICD-10-CM | POA: Diagnosis not present

## 2023-12-22 MED ORDER — CEPHALEXIN 500 MG PO CAPS
500.0000 mg | ORAL_CAPSULE | Freq: Two times a day (BID) | ORAL | 0 refills | Status: AC
  Filled 2023-12-22: qty 14, 7d supply, fill #0

## 2023-12-22 NOTE — Progress Notes (Signed)

## 2023-12-31 ENCOUNTER — Other Ambulatory Visit: Payer: Self-pay

## 2023-12-31 MED ORDER — AMOXICILLIN-POT CLAVULANATE 500-125 MG PO TABS
1.0000 | ORAL_TABLET | Freq: Two times a day (BID) | ORAL | 0 refills | Status: AC
  Filled 2023-12-31: qty 14, 7d supply, fill #0

## 2024-04-07 ENCOUNTER — Ambulatory Visit
